# Patient Record
Sex: Female | Born: 1970 | Race: Black or African American | Hispanic: No | Marital: Married | Smoking: Former smoker
Health system: Southern US, Community
[De-identification: ages and names within clinical notes are randomized; demographics above are authoritative.]

## PROBLEM LIST (undated history)

## (undated) DIAGNOSIS — B373 Candidiasis of vulva and vagina: Secondary | ICD-10-CM

## (undated) DIAGNOSIS — IMO0002 Reserved for concepts with insufficient information to code with codable children: Secondary | ICD-10-CM

## (undated) DIAGNOSIS — Z8669 Personal history of other diseases of the nervous system and sense organs: Secondary | ICD-10-CM

## (undated) DIAGNOSIS — F32A Depression, unspecified: Secondary | ICD-10-CM

## (undated) DIAGNOSIS — K219 Gastro-esophageal reflux disease without esophagitis: Secondary | ICD-10-CM

## (undated) DIAGNOSIS — C801 Malignant (primary) neoplasm, unspecified: Secondary | ICD-10-CM

## (undated) DIAGNOSIS — F419 Anxiety disorder, unspecified: Secondary | ICD-10-CM

## (undated) DIAGNOSIS — K8050 Calculus of bile duct without cholangitis or cholecystitis without obstruction: Secondary | ICD-10-CM

## (undated) DIAGNOSIS — E0590 Thyrotoxicosis, unspecified without thyrotoxic crisis or storm: Secondary | ICD-10-CM

## (undated) DIAGNOSIS — I10 Essential (primary) hypertension: Secondary | ICD-10-CM

## (undated) DIAGNOSIS — G43909 Migraine, unspecified, not intractable, without status migrainosus: Secondary | ICD-10-CM

## (undated) HISTORY — DX: Anxiety disorder, unspecified: F41.9

## (undated) HISTORY — DX: Gastro-esophageal reflux disease without esophagitis: K21.9

## (undated) HISTORY — DX: Depression, unspecified: F32.A

## (undated) HISTORY — DX: Malignant (primary) neoplasm, unspecified: C80.1

## (undated) HISTORY — DX: Personal history of other diseases of the nervous system and sense organs: Z86.69

## (undated) HISTORY — PX: MULTIPLE TOOTH EXTRACTIONS: SHX2053

## (undated) HISTORY — PX: KNEE ARTHROSCOPY: SUR90

## (undated) HISTORY — PX: CERCLAGE REMOVAL: SUR1451

## (undated) HISTORY — DX: Reserved for concepts with insufficient information to code with codable children: IMO0002

## (undated) HISTORY — PX: WISDOM TOOTH EXTRACTION: SHX21

## (undated) HISTORY — DX: Migraine, unspecified, not intractable, without status migrainosus: G43.909

## (undated) HISTORY — PX: OTHER SURGICAL HISTORY: SHX169

## (undated) HISTORY — DX: Candidiasis of vulva and vagina: B37.3

## (undated) HISTORY — PX: TUBAL LIGATION: SHX77

---

## 1998-07-25 ENCOUNTER — Encounter: Admission: RE | Admit: 1998-07-25 | Discharge: 1998-10-23 | Payer: Self-pay | Admitting: Obstetrics

## 1998-08-26 ENCOUNTER — Inpatient Hospital Stay (HOSPITAL_COMMUNITY): Admission: AD | Admit: 1998-08-26 | Discharge: 1998-08-26 | Payer: Self-pay | Admitting: *Deleted

## 1998-08-29 ENCOUNTER — Inpatient Hospital Stay (HOSPITAL_COMMUNITY): Admission: AD | Admit: 1998-08-29 | Discharge: 1998-08-29 | Payer: Self-pay | Admitting: Obstetrics & Gynecology

## 1998-09-10 ENCOUNTER — Inpatient Hospital Stay (HOSPITAL_COMMUNITY): Admission: AD | Admit: 1998-09-10 | Discharge: 1998-09-11 | Payer: Self-pay | Admitting: Obstetrics

## 1998-09-18 ENCOUNTER — Encounter: Admission: RE | Admit: 1998-09-18 | Discharge: 1998-09-18 | Payer: Self-pay | Admitting: Obstetrics & Gynecology

## 1998-10-02 ENCOUNTER — Encounter: Admission: RE | Admit: 1998-10-02 | Discharge: 1998-10-02 | Payer: Self-pay | Admitting: Obstetrics

## 1998-10-17 ENCOUNTER — Encounter: Admission: RE | Admit: 1998-10-17 | Discharge: 1998-10-17 | Payer: Self-pay | Admitting: Obstetrics

## 1998-10-24 ENCOUNTER — Ambulatory Visit (HOSPITAL_COMMUNITY): Admission: RE | Admit: 1998-10-24 | Discharge: 1998-10-24 | Payer: Self-pay | Admitting: Obstetrics

## 1998-10-24 ENCOUNTER — Encounter: Admission: RE | Admit: 1998-10-24 | Discharge: 1998-10-24 | Payer: Self-pay | Admitting: Obstetrics

## 1998-11-04 ENCOUNTER — Observation Stay (HOSPITAL_COMMUNITY): Admission: AD | Admit: 1998-11-04 | Discharge: 1998-11-05 | Payer: Self-pay | Admitting: Obstetrics & Gynecology

## 1998-11-08 ENCOUNTER — Inpatient Hospital Stay (HOSPITAL_COMMUNITY): Admission: AD | Admit: 1998-11-08 | Discharge: 1998-11-08 | Payer: Self-pay | Admitting: *Deleted

## 1998-12-12 ENCOUNTER — Encounter: Admission: RE | Admit: 1998-12-12 | Discharge: 1998-12-12 | Payer: Self-pay | Admitting: Obstetrics

## 1998-12-16 ENCOUNTER — Inpatient Hospital Stay (HOSPITAL_COMMUNITY): Admission: AD | Admit: 1998-12-16 | Discharge: 1998-12-20 | Payer: Self-pay | Admitting: *Deleted

## 1998-12-26 ENCOUNTER — Encounter: Admission: RE | Admit: 1998-12-26 | Discharge: 1998-12-26 | Payer: Self-pay | Admitting: Obstetrics

## 1999-01-13 ENCOUNTER — Inpatient Hospital Stay (HOSPITAL_COMMUNITY): Admission: AD | Admit: 1999-01-13 | Discharge: 1999-01-13 | Payer: Self-pay | Admitting: Obstetrics & Gynecology

## 1999-01-16 ENCOUNTER — Encounter: Admission: RE | Admit: 1999-01-16 | Discharge: 1999-01-16 | Payer: Self-pay | Admitting: Obstetrics

## 1999-01-23 ENCOUNTER — Encounter: Admission: RE | Admit: 1999-01-23 | Discharge: 1999-01-23 | Payer: Self-pay | Admitting: Obstetrics

## 1999-01-26 ENCOUNTER — Inpatient Hospital Stay (HOSPITAL_COMMUNITY): Admission: AD | Admit: 1999-01-26 | Discharge: 1999-01-29 | Payer: Self-pay | Admitting: Obstetrics & Gynecology

## 1999-01-31 ENCOUNTER — Encounter (HOSPITAL_COMMUNITY): Admission: RE | Admit: 1999-01-31 | Discharge: 1999-05-01 | Payer: Self-pay | Admitting: *Deleted

## 2001-03-17 ENCOUNTER — Emergency Department (HOSPITAL_COMMUNITY): Admission: EM | Admit: 2001-03-17 | Discharge: 2001-03-17 | Payer: Self-pay | Admitting: Emergency Medicine

## 2001-03-24 ENCOUNTER — Emergency Department (HOSPITAL_COMMUNITY): Admission: EM | Admit: 2001-03-24 | Discharge: 2001-03-24 | Payer: Self-pay | Admitting: Emergency Medicine

## 2004-08-24 ENCOUNTER — Inpatient Hospital Stay (HOSPITAL_COMMUNITY): Admission: EM | Admit: 2004-08-24 | Discharge: 2004-08-25 | Payer: Self-pay | Admitting: Emergency Medicine

## 2004-08-27 ENCOUNTER — Other Ambulatory Visit (HOSPITAL_COMMUNITY): Admission: RE | Admit: 2004-08-27 | Discharge: 2004-11-25 | Payer: Self-pay | Admitting: Psychiatry

## 2004-08-27 ENCOUNTER — Ambulatory Visit: Payer: Self-pay | Admitting: Psychiatry

## 2004-09-16 ENCOUNTER — Ambulatory Visit (HOSPITAL_COMMUNITY): Payer: Self-pay | Admitting: Professional Counselor

## 2004-09-23 ENCOUNTER — Ambulatory Visit (HOSPITAL_COMMUNITY): Payer: Self-pay | Admitting: Professional Counselor

## 2004-10-06 ENCOUNTER — Ambulatory Visit (HOSPITAL_COMMUNITY): Payer: Self-pay | Admitting: Professional Counselor

## 2004-10-10 ENCOUNTER — Ambulatory Visit (HOSPITAL_COMMUNITY): Payer: Self-pay | Admitting: Psychiatry

## 2004-10-15 ENCOUNTER — Ambulatory Visit (HOSPITAL_COMMUNITY): Payer: Self-pay | Admitting: Professional Counselor

## 2004-10-23 ENCOUNTER — Ambulatory Visit (HOSPITAL_COMMUNITY): Payer: Self-pay | Admitting: Professional Counselor

## 2004-11-05 ENCOUNTER — Ambulatory Visit (HOSPITAL_COMMUNITY): Payer: Self-pay | Admitting: Psychiatry

## 2005-02-20 ENCOUNTER — Emergency Department (HOSPITAL_COMMUNITY): Admission: EM | Admit: 2005-02-20 | Discharge: 2005-02-20 | Payer: Self-pay | Admitting: Emergency Medicine

## 2005-06-16 ENCOUNTER — Emergency Department (HOSPITAL_COMMUNITY): Admission: EM | Admit: 2005-06-16 | Discharge: 2005-06-16 | Payer: Self-pay | Admitting: Emergency Medicine

## 2005-06-16 ENCOUNTER — Ambulatory Visit (HOSPITAL_COMMUNITY): Admission: RE | Admit: 2005-06-16 | Discharge: 2005-06-16 | Payer: Self-pay | Admitting: Emergency Medicine

## 2006-01-29 ENCOUNTER — Emergency Department (HOSPITAL_COMMUNITY): Admission: EM | Admit: 2006-01-29 | Discharge: 2006-01-29 | Payer: Self-pay | Admitting: Emergency Medicine

## 2006-09-10 ENCOUNTER — Inpatient Hospital Stay (HOSPITAL_COMMUNITY): Admission: AD | Admit: 2006-09-10 | Discharge: 2006-09-10 | Payer: Self-pay | Admitting: Obstetrics & Gynecology

## 2007-04-22 ENCOUNTER — Ambulatory Visit (HOSPITAL_COMMUNITY): Admission: RE | Admit: 2007-04-22 | Discharge: 2007-04-22 | Payer: Self-pay | Admitting: Obstetrics & Gynecology

## 2007-05-31 ENCOUNTER — Inpatient Hospital Stay (HOSPITAL_COMMUNITY): Admission: AD | Admit: 2007-05-31 | Discharge: 2007-05-31 | Payer: Self-pay | Admitting: Obstetrics & Gynecology

## 2007-06-09 ENCOUNTER — Ambulatory Visit (HOSPITAL_COMMUNITY): Admission: RE | Admit: 2007-06-09 | Discharge: 2007-06-09 | Payer: Self-pay | Admitting: Obstetrics & Gynecology

## 2007-09-02 ENCOUNTER — Encounter: Admission: RE | Admit: 2007-09-02 | Discharge: 2007-10-07 | Payer: Self-pay | Admitting: Obstetrics & Gynecology

## 2007-10-14 ENCOUNTER — Inpatient Hospital Stay (HOSPITAL_COMMUNITY): Admission: AD | Admit: 2007-10-14 | Discharge: 2007-10-14 | Payer: Self-pay | Admitting: Obstetrics & Gynecology

## 2007-11-04 ENCOUNTER — Inpatient Hospital Stay (HOSPITAL_COMMUNITY): Admission: AD | Admit: 2007-11-04 | Discharge: 2007-11-04 | Payer: Self-pay | Admitting: Obstetrics

## 2007-11-19 ENCOUNTER — Inpatient Hospital Stay (HOSPITAL_COMMUNITY): Admission: AD | Admit: 2007-11-19 | Discharge: 2007-11-21 | Payer: Self-pay | Admitting: Obstetrics

## 2007-11-20 ENCOUNTER — Encounter: Payer: Self-pay | Admitting: Obstetrics

## 2007-11-28 ENCOUNTER — Inpatient Hospital Stay (HOSPITAL_COMMUNITY): Admission: AD | Admit: 2007-11-28 | Discharge: 2007-12-01 | Payer: Self-pay | Admitting: Obstetrics

## 2007-11-30 ENCOUNTER — Ambulatory Visit: Payer: Self-pay | Admitting: Cardiology

## 2008-05-02 ENCOUNTER — Emergency Department (HOSPITAL_COMMUNITY): Admission: EM | Admit: 2008-05-02 | Discharge: 2008-05-02 | Payer: Self-pay | Admitting: Emergency Medicine

## 2009-01-21 ENCOUNTER — Encounter: Admission: RE | Admit: 2009-01-21 | Discharge: 2009-01-21 | Payer: Self-pay | Admitting: Family Medicine

## 2010-06-11 ENCOUNTER — Emergency Department (HOSPITAL_COMMUNITY): Admission: EM | Admit: 2010-06-11 | Discharge: 2010-06-11 | Payer: Self-pay | Admitting: Emergency Medicine

## 2010-06-11 ENCOUNTER — Emergency Department (HOSPITAL_COMMUNITY): Admission: EM | Admit: 2010-06-11 | Discharge: 2010-06-11 | Payer: Self-pay | Admitting: Family Medicine

## 2010-06-12 ENCOUNTER — Emergency Department (HOSPITAL_COMMUNITY): Admission: EM | Admit: 2010-06-12 | Discharge: 2010-06-12 | Payer: Self-pay | Admitting: Emergency Medicine

## 2011-02-22 LAB — DIFFERENTIAL
Basophils Absolute: 0 10*3/uL (ref 0.0–0.1)
Basophils Absolute: 0 10*3/uL (ref 0.0–0.1)
Basophils Relative: 0 % (ref 0–1)
Eosinophils Absolute: 0.1 10*3/uL (ref 0.0–0.7)
Eosinophils Relative: 1 % (ref 0–5)
Lymphocytes Relative: 49 % — ABNORMAL HIGH (ref 12–46)
Lymphocytes Relative: 42 % (ref 12–46)
Lymphs Abs: 2.7 10*3/uL (ref 0.7–4.0)
Lymphs Abs: 2.7 10*3/uL (ref 0.7–4.0)
Neutrophils Relative %: 43 % (ref 43–77)

## 2011-02-22 LAB — GC/CHLAMYDIA PROBE AMP, GENITAL
Chlamydia, DNA Probe: NEGATIVE
GC Probe Amp, Genital: NEGATIVE

## 2011-02-22 LAB — POCT PREGNANCY, URINE
Preg Test, Ur: NEGATIVE
Preg Test, Ur: NEGATIVE

## 2011-02-22 LAB — CBC
HCT: 43.1 % (ref 36.0–46.0)
Hemoglobin: 14.9 g/dL (ref 12.0–15.0)
MCH: 31.7 pg (ref 26.0–34.0)
MCHC: 34.6 g/dL (ref 30.0–36.0)
MCHC: 34.8 g/dL (ref 30.0–36.0)
Platelets: 167 10*3/uL (ref 150–400)
RBC: 4.7 MIL/uL (ref 3.87–5.11)
RBC: 4.76 MIL/uL (ref 3.87–5.11)
RDW: 12.6 % (ref 11.5–15.5)
RDW: 12.8 % (ref 11.5–15.5)
WBC: 5.4 10*3/uL (ref 4.0–10.5)

## 2011-02-22 LAB — COMPREHENSIVE METABOLIC PANEL
ALT: 17 U/L (ref 0–35)
BUN: 14 mg/dL (ref 6–23)
CO2: 25 mEq/L (ref 19–32)
GFR calc non Af Amer: >60 mL/min (ref >60)
Glucose, Bld: 105 mg/dL — ABNORMAL HIGH (ref 70–99)
Potassium: 4 mEq/L (ref 3.5–5.1)
Total Protein: 7.3 g/dL (ref 6.0–8.3)

## 2011-02-22 LAB — URINALYSIS, ROUTINE W REFLEX MICROSCOPIC
Nitrite: NEGATIVE
Protein, ur: NEGATIVE mg/dL
Specific Gravity, Urine: 1.04 — ABNORMAL HIGH (ref 1.005–1.030)

## 2011-02-22 LAB — POCT URINALYSIS DIP (DEVICE)
Glucose, UA: NEGATIVE mg/dL
Hgb urine dipstick: NEGATIVE
Nitrite: POSITIVE — AB
pH: 7 (ref 5.0–8.0)

## 2011-02-22 LAB — POCT I-STAT, CHEM 8
Chloride: 104 mEq/L (ref 96–112)
Creatinine, Ser: 1.2 mg/dL (ref 0.4–1.2)
HCT: 46 % (ref 36.0–46.0)
TCO2: 26 mmol/L (ref 0–100)

## 2011-02-22 LAB — WET PREP, GENITAL
Trich, Wet Prep: NONE SEEN
Yeast Wet Prep HPF POC: NONE SEEN

## 2011-04-21 NOTE — Op Note (Signed)
NAMEIONE, SANDUSKY             ACCOUNT NO.:  [id number]   MEDICAL RECORD NO.:  [id number]          PATIENT TYPE:  AMB   LOCATION:  SDC                           [facility]:  [facility]   PHYSICIAN:  Roseanna Rainbow, M.D.DATE OF BIRTH:  1971/12/06   DATE OF PROCEDURE:  06/09/2007  DATE OF DISCHARGE:                               OPERATIVE REPORT   PREOPERATIVE DIAGNOSIS:  Intrauterine pregnancy at 14+ weeks, cervical  insufficiency.   POSTOPERATIVE DIAGNOSIS:  Intrauterine pregnancy at 14+ weeks, cervical  insufficiency.   PROCEDURE:  McDonald cerclage.   SURGEON:  Dr. Tamela Oddi   ANESTHESIA:  Spinal.   ESTIMATED BLOOD LOSS:  100 mL.   COMPLICATIONS:  None.   PROCEDURE:  The patient was taken to the operating room with an IV  running.  She was given a spinal anesthetic and placed in the dorsal  lithotomy position and prepped and draped in the usual sterile fashion.  A weighted speculum and Sims retractors were then placed into the  vagina.  The demarcation between the cervix and bladder point was  obscured somewhat by the previous scarring from her previous cerclage  procedures.  The vaginal mucosa at the junction between the cervix and  bladder was infiltrated with sterile saline.  The vaginal mucosa was  then incised with a scalpel.  The vaginal mucosa was then incised  anteriorly.  The demarcation between the cervix and the bladder was  better defined using blunt dissection.  At this point a double #1 silk  suture was then placed in the cervix stroma in a pursestring fashion  taking care to avoid the endocervical canal.  The suture was then tied  at 12 o'clock.  The defect in the vaginal mucosa was reapproximated  using interrupted figure-of-eight sutures of 2-0 Vicryl.  Adequate  hemostasis was noted.  The vagina was then irrigated.  All the  instruments were then removed from the vagina.  At the close of the  procedure the instrument pack counts were said to be  correct x2.  The  patient was taken to the PACU awake and in stable condition.      Roseanna Rainbow, M.D.  Electronically Signed     LAJ/MEDQ  D:  06/09/2007  T:  06/09/2007  Job:  [id number]

## 2011-04-21 NOTE — Op Note (Signed)
[name], Kathy Powell             ACCOUNT NO.:  [id number]   MEDICAL RECORD NO.:  [id number]          PATIENT TYPE:  INP   LOCATION:  9130                          [facility]ACILITY:  [facility]   PHYSICIAN:  Charles A. Clearance Coots, M.D.DATE OF BIRTH:  April 03, 1971   DATE OF PROCEDURE:  11/20/2007  DATE OF DISCHARGE:                               OPERATIVE REPORT   PREOPERATIVE DIAGNOSIS:  Desires sterilization.   POSTOPERATIVE DIAGNOSIS:  Desires sterilization.   PROCEDURE:  Bilateral partial salpingectomy.   SURGEON:  Brock Bad, MD   ANESTHESIA:  Epidural.   ESTIMATED BLOOD LOSS:  Negligible.   COMPLICATIONS:  None.   SPECIMENS:  Approximately 2 cm section of right and left fallopian tube.   DISPOSITION OF SPECIMEN:  Pathology.   PROCEDURE IN DETAIL:  The patient was brought to the operating room  after satisfactory redosing of the epidural.  The abdomen was prepped  and draped in the usual sterile fashion.  A small inferior umbilical  incision was made with a scalpel, that was deepened down to the fascia  with curved Mayo scissors bluntly.  The fascia was grasped in the  midline with Kocher forceps and was cut transversely with curved Mayo  scissors.  The peritoneum was grasped with hemostats and was bluntly  entered with the right angle retractors.  The left fallopian tube was  identified and was grasped with a Babcock clamp.  The tube was  identified from the cornual end to the fimbrial end and grasped with  Babcock clamps and the tube was then regrasped in the isthmic area of  the tube with a Babcock clamp and a knuckle of tube beneath the Babcock  clamp was suture ligated through the mesosalpinx with zero plain catgut.  The section of the tube above the knot was excised with Metzenbaum  scissors and submitted to pathology for evaluation.  There was no active  bleeding at the conclusion of the procedure.  The tube was then placed  back in its normal anatomic position.  The same  procedure was performed  on the opposite side without complications.  The abdomen was then closed  as follows.  Peritoneum and fascia was closed as one with a continuous  suture of 2-0  Vicryl.  Skin was closed with a continuous subcuticular suture of 3-0  Monocryl.  Sterile bandage was applied to the incision closure.  Surgical technician indicated that all needle, sponge and instrument  counts were correct x2.  The patient tolerated the procedure well, was  transported to the recovery room in satisfactory condition.      Charles A. Clearance Coots, M.D.  Electronically Signed     CAH/MEDQ  D:  11/20/2007  T:  11/21/2007  Job:  [id number]

## 2011-04-24 NOTE — H&P (Signed)
NAME:  Kathy Powell, Kathy Powell                       ACCOUNT NO.:  [id number]   MEDICAL RECORD NO.:  [id number]                   PATIENT TYPE:  INP   LOCATION:  5021                                 FACILITY:  [facility]   PHYSICIAN:  Renato Battles, M.D.                  DATE OF BIRTH:  July 12, 1971   DATE OF ADMISSION:  08/23/2004  DATE OF DISCHARGE:                                HISTORY & PHYSICAL   REASON FOR ADMISSION:  Overdose and suicide attempt.   HISTORY OF PRESENT ILLNESS:  The patient is a 40 year old African-American  female who overdosed on prescription Xanax and was brought to the emergency  room with over sedation.  The patient reported to EMS that she had intention  to hurt herself because she has been under a lot of stress.  At the time of  interview, the patient was extremely sleepy.  She was arousable but she  could not give any meaningful conversation, so she was limited.   PAST MEDICAL HISTORY:  Positive for anxiety.   PAST SURGICAL HISTORY:  None.   FAMILY HISTORY:  Unknown.   SOCIAL HISTORY:  Not obtainable.   ALLERGIES:  No known drug allergies.   HOME MEDICATIONS:  1.  Xanax.  2.  Naproxen.  3.  Prilosec.   PHYSICAL EXAMINATION:  GENERAL:  The patient is very sleepy, arousable but  cannot not hold conversation.  VITAL SIGNS:  Temperature 98.3, heart rate 82, respiratory rate 20, blood  pressure 120/72.  HEENT:  Head is atraumatic and normocephalic.  Pupils equal, round, and  reactive to light and accommodation.  Extraocular movements intact.  NECK:  No adenopathy, no thyromegaly, no JVD.  CHEST:  Clear to auscultation bilaterally.  No wheezing, rales, or rhonchi.  HEART:  Regular rate and rhythm.  No murmurs.  ABDOMEN:  Soft, moderate tenderness in the right lower quadrant and left  lower quadrant.  No guarding, no rebound tenderness.  EXTREMITIES:  No cyanosis, edema, or clubbing.   STUDIES:  ISTAT showed normal electrolytes.  Normal hemoglobin and  hematocrit.  Pregnancy test was negative.  Alcohol level was low.  Aspirin  and acetaminophen levels were also.  Urine drug screen was positive only for  benzodiazepines.   IMPRESSION:  1.  Suicide attempt.  2.  Benzodiazepine overdose.  3.  Anxiety.   PLAN:  1.  Admit with sitter.  2.  Monitor for next 24 hours until patient wakes up.  Would not use      __________.  3.  Consult psychiatry for clearing patient or transferring to psychiatry      service.   PRIMARY CARE PHYSICIAN:  Renaye Rakers, M.D.       SA/MEDQ  D:  08/24/2004  T:  08/24/2004  Job:  [id number]   cc:   Renaye Rakers, M.D.  [phone]  [place]  [place] [address]  Fax: [phone]

## 2011-04-24 NOTE — Discharge Summary (Signed)
Kathy Powell, CALKIN             ACCOUNT NO.:  [id number]   MEDICAL RECORD NO.:  [id number]          PATIENT TYPE:  INP   LOCATION:  9319                          [facility]   PHYSICIAN:  Roseanna Rainbow, M.D.DATE OF BIRTH:  1971/09/20   DATE OF ADMISSION:  11/28/2007  DATE OF DISCHARGE:  12/01/2007                               DISCHARGE SUMMARY   CHIEF COMPLAINT:  The patient is a 40 year old gravida 5, para 5, status  post spontaneous vaginal delivery on December 13th, who presented to the  office with elevated blood pressures.   HISTORY OF PRESENT ILLNESS:  The patient also complained of headaches  and pleuritic chest pain.   PAST SURGICAL HISTORY:  Cervical conization.   PAST MEDICAL HISTORY:  She denies.   MEDICATIONS:  Prenatal vitamins.   ALLERGIES:  ASPIRIN.   SOCIAL HISTORY:  She is engaged.  She reports current tobacco use.  She  denies any ethanol or drug use.   PHYSICAL EXAM:  VITAL SIGNS:  Afebrile, blood pressure 170/90s to 100s.  LUNGS:  Clear to auscultation.  HEART:  Regular rate and rhythm.  PELVIC:  Exam omitted.  EXTREMITIES:  Deep tendon reflexes, no clonus, 2+ edema.   LABS:  Uric acid 7.6, SGOT and SGPT 28 and 42 respectively.  Platelets  260,000.   Chest x-ray:  Pleural effusions and interstitial edema.   ASSESSMENT AND PLAN:  Postpartum severe preeclampsia.  Plan admission,  magnesium sulfate, seizure prophylaxis, p.o. labetalol.   HOSPITAL COURSE:  The patient was admitted.  She was started on  parenteral magnesium sulfate.  Her headaches improved.  Her blood  pressures were controlled on the labetalol.  The magnesium sulfate was  discontinued on December 24.  Her O2 sats had remained greater than 95%  on room air.  The chest x-ray was repeated, and there was no active  disease.  A 2-D echo on December 24 was read as within normal limits  with slight left ventricular hypertrophy.  An EKG demonstrated normal  sinus rhythm at 80  beats per minute.   LABS:  On December 25, borderline hypokalemia with a potassium of 3.2.  A thyroid panel was normal.  Cardiac enzymes were normal as well.  At  this point, the decision was made to discharge the patient home.   DISCHARGE DIAGNOSIS:  Postpartum severe preeclampsia with pulmonary  edema, condition stable.   DIET:  Regular.   ACTIVITY:  Modified bed rest.   MEDICATIONS:  Included labetalol, hydrochlorothiazide, and K-Dur.   DISPOSITION:  The patient was to follow up in the office in several  days.      Roseanna Rainbow, M.D.  Electronically Signed     LAJ/MEDQ  D:  12/23/2007  T:  12/24/2007  Job:  [id number]

## 2011-09-02 LAB — POCT URINALYSIS DIP (DEVICE)
Glucose, UA: NEGATIVE
Ketones, ur: NEGATIVE
Nitrite: NEGATIVE
Operator id: 247071
pH: 5.5

## 2011-09-02 LAB — DIFFERENTIAL
Basophils Absolute: 0
Basophils Relative: 0
Eosinophils Absolute: 0
Monocytes Relative: 5
Neutro Abs: 4.6
Neutrophils Relative %: 79 — ABNORMAL HIGH

## 2011-09-02 LAB — HEPATIC FUNCTION PANEL
AST: 14
Albumin: 4.2
Bilirubin, Direct: 0.1
Total Bilirubin: 0.7

## 2011-09-02 LAB — POCT PREGNANCY, URINE: Preg Test, Ur: NEGATIVE

## 2011-09-02 LAB — CBC
MCHC: 34.7
MCV: 86.5
RBC: 4.99
RDW: 12.8

## 2011-09-02 LAB — LIPASE, BLOOD: Lipase: 15

## 2011-09-11 LAB — BASIC METABOLIC PANEL
BUN: 5 — ABNORMAL LOW
CO2: 29
Calcium: 7.6 — ABNORMAL LOW
Chloride: 103
Chloride: 104
Creatinine, Ser: 0.92
GFR calc Af Amer: >60
Potassium: 3 — ABNORMAL LOW
Sodium: 141

## 2011-09-11 LAB — COMPREHENSIVE METABOLIC PANEL
AST: 28
Albumin: 3 — ABNORMAL LOW
Calcium: 8.6
Creatinine, Ser: 0.86
GFR calc Af Amer: >60
Sodium: 138
Total Protein: 5.8 — ABNORMAL LOW

## 2011-09-11 LAB — TSH: TSH: 0.176 — ABNORMAL LOW

## 2011-09-11 LAB — URINALYSIS, ROUTINE W REFLEX MICROSCOPIC
Ketones, ur: NEGATIVE
Leukocytes, UA: NEGATIVE
Nitrite: NEGATIVE
Protein, ur: NEGATIVE
pH: 6.5

## 2011-09-11 LAB — CARDIAC PANEL(CRET KIN+CKTOT+MB+TROPI)
CK, MB: 1
Relative Index: 0.6
Total CK: 169

## 2011-09-11 LAB — URINE MICROSCOPIC-ADD ON

## 2011-09-11 LAB — CBC
MCHC: 34.8
MCV: 89.5
Platelets: 260

## 2011-09-14 LAB — CBC
Hemoglobin: 10.6 — ABNORMAL LOW
MCHC: 35.3
MCV: 88.5
Platelets: 177
Platelets: 195
RDW: 14.7
WBC: 7.7

## 2011-09-14 LAB — RPR: RPR Ser Ql: NONREACTIVE

## 2011-09-15 LAB — URINALYSIS, ROUTINE W REFLEX MICROSCOPIC
Nitrite: NEGATIVE
Specific Gravity, Urine: 1.01
Urobilinogen, UA: 0.2

## 2011-09-22 LAB — CBC
RBC: 4.47
WBC: 8.1

## 2012-09-21 ENCOUNTER — Encounter: Payer: Self-pay | Admitting: Obstetrics and Gynecology

## 2012-09-21 ENCOUNTER — Ambulatory Visit (INDEPENDENT_AMBULATORY_CARE_PROVIDER_SITE_OTHER): Payer: 59 | Admitting: Obstetrics and Gynecology

## 2012-09-21 ENCOUNTER — Telehealth: Payer: Self-pay

## 2012-09-21 VITALS — BP 118/76 | Ht 65.6 in | Wt 242.0 lb

## 2012-09-21 DIAGNOSIS — Z01419 Encounter for gynecological examination (general) (routine) without abnormal findings: Secondary | ICD-10-CM

## 2012-09-21 DIAGNOSIS — Z124 Encounter for screening for malignant neoplasm of cervix: Secondary | ICD-10-CM

## 2012-09-21 NOTE — Progress Notes (Addendum)
The patient reports:normal menses, no abnormal bleeding, pelvic pain or discharge  Contraception:bilateral tubal ligation  Last mammogram: patient has never had a mammogram  Last pap: was normal August  2012  GC/Chlamydia cultures offered: declined HIV/RPR/HbsAg offered:  declined HSV 1 and 2 glycoprotein offered: declined  Menstrual cycle regular and monthly: Yes Menstrual flow normal: Yes  Urinary symptoms: none Normal bowel movements: Yes Reports abuse at home: No:  Pt stated no issues today .  Subjective:    Kathy Powell is a 41 y.o. female, [id number], who presents for an annual exam as a new patient today. Had PP BTL and may be interested in tubal reversal.    History   Social History  . Marital Status: Unknown    Spouse Name: N/A    Number of Children: N/A  . Years of Education: N/A   Social History Main Topics  . Smoking status: Light Tobacco Smoker  . Smokeless tobacco: Never Used  . Alcohol Use: No  . Drug Use: No  . Sexually Active: Yes    Birth Control/ Protection: Surgical     BTL   Other Topics Concern  . None   Social History Narrative  . None    Menstrual cycle:   LMP: Patient's last menstrual period was 08/09/2012.           Cycle: normal  The following portions of the patient's history were reviewed and updated as appropriate: allergies, current medications, past family history, past medical history, past social history, past surgical history and problem list.  Review of Systems Pertinent items are noted in HPI. Breast:Negative for breast lump,nipple discharge or nipple retraction Gastrointestinal: Negative for abdominal pain, change in bowel habits or rectal bleeding Urinary:negative   Objective:    BP 118/76  Ht 5' 5.6" (1.666 m)  Wt 242 lb (109.77 kg)  BMI 39.54 kg/m2  LMP 08/09/2012    Weight:  Wt Readings from Last 1 Encounters:  09/21/12 242 lb (109.77 kg)          BMI: Body mass index is 39.54 kg/(m^2).  General Appearance:  Alert, appropriate appearance for age. No acute distress HEENT: Grossly normal Neck / Thyroid: Supple, no masses, nodes or enlargement Lungs: clear to auscultation bilaterally Back: No CVA tenderness Breast Exam: No masses or nodes.No dimpling, nipple retraction or discharge. Cardiovascular: Regular rate and rhythm. S1, S2, no murmur Gastrointestinal: Soft, non-tender, no masses or organomegaly Pelvic Exam: Vulva and vagina appear normal. Bimanual exam reveals normal uterus and adnexa. Rectovaginal: not indicated Lymphatic Exam: Non-palpable nodes in neck, clavicular, axillary, or inguinal regions Skin: no rash or abnormalities Neurologic: Normal gait and speech, no tremor  Psychiatric: Alert and oriented, appropriate affect.   Assessment:    Normal gyn exam  S/p PP BTL maybe interested in tubal reversal   Plan:    pap smear return annually or prn STD screening: declined Contraception:bilateral tubal ligation  Will obtain op report for BTL to determine if pt is a candidate for tubal reversal. Will call pt after review. If desires to proceed, will nee [facility] before   Silverio Lay MD      Op report received: [place] with 1.5 cm  and 1.3 cm of tubal segment removed. Pt may be a candidate for reversal but needs AMH before proceeding further. Will call pt.  Silverio Lay MD

## 2012-09-21 NOTE — Telephone Encounter (Signed)
LM to RC.  Pt may be candidate for anastimosis.  If she wishes to proceed, she will need AMH.  Dx lap will be done first, if found to be favorable will go ahead with surg. Per SR.  ld

## 2012-09-27 ENCOUNTER — Telehealth: Payer: Self-pay

## 2012-09-27 DIAGNOSIS — N979 Female infertility, unspecified: Secondary | ICD-10-CM

## 2012-09-27 NOTE — Telephone Encounter (Signed)
Pt notified of SR note.  Pt needs AMH, then surg was explained that dx lap will be done first, if found to be favorable, she will proceeds with anastimosis.  Pt agreeable. ld

## 2012-09-30 ENCOUNTER — Other Ambulatory Visit: Payer: 59

## 2012-09-30 DIAGNOSIS — N979 Female infertility, unspecified: Secondary | ICD-10-CM

## 2012-11-23 ENCOUNTER — Telehealth: Payer: Self-pay

## 2012-11-23 NOTE — Telephone Encounter (Signed)
Advised pt of lab results. Pt voiced understanding. Kathy Powell, CMA

## 2012-11-23 NOTE — Progress Notes (Signed)
Quick Note:  Please inform that AMH is normal: good egg reserve ______

## 2012-11-23 NOTE — Telephone Encounter (Signed)
Advised pt of normal lab work. Pt is wanting to know what the next step from here is states that she was planning on tubal reversal.   Darien Ramus, CMA

## 2012-11-23 NOTE — Telephone Encounter (Signed)
Message copied by Darien Ramus on Wed Nov 23, 2012  6:15 PM ------      Message from: Silverio Lay      Created: Wed Nov 23, 2012  2:34 PM       Please inform that [facility]H is normal: good egg reserve

## 2012-11-25 NOTE — Telephone Encounter (Signed)
Needs to talk to Adrianne for scheduling, pre-payment and pre-op visit with EP / SR

## 2012-11-28 ENCOUNTER — Telehealth: Payer: Self-pay | Admitting: Obstetrics and Gynecology

## 2013-01-20 ENCOUNTER — Emergency Department (HOSPITAL_COMMUNITY)
Admission: EM | Admit: 2013-01-20 | Discharge: 2013-01-20 | Disposition: A | Discharge status: Home / Self Care | Payer: 59 | Source: Home / Self Care | Attending: Emergency Medicine | Admitting: Emergency Medicine

## 2013-01-20 ENCOUNTER — Encounter (HOSPITAL_COMMUNITY): Payer: Self-pay | Admitting: *Deleted

## 2013-01-20 DIAGNOSIS — K2970 Gastritis, unspecified, without bleeding: Secondary | ICD-10-CM

## 2013-01-20 DIAGNOSIS — K2990 Gastroduodenitis, unspecified, without bleeding: Secondary | ICD-10-CM

## 2013-01-20 DIAGNOSIS — R51 Headache: Secondary | ICD-10-CM

## 2013-01-20 MED ORDER — ONDANSETRON 4 MG PO TBDP
4.0000 mg | ORAL_TABLET | Freq: Once | ORAL | Status: AC
  Administered 2013-01-20: 4 mg via ORAL

## 2013-01-20 MED ORDER — ONDANSETRON 4 MG PO TBDP
ORAL_TABLET | ORAL | Status: AC
  Filled 2013-01-20: qty 1

## 2013-01-20 MED ORDER — ONDANSETRON HCL 4 MG PO TABS: 4.0000 mg | ORAL_TABLET | Freq: Three times a day (TID) | ORAL | Status: DC | PRN

## 2013-01-20 NOTE — ED Provider Notes (Signed)
Medical screening examination/treatment/procedure(s) were performed by non-physician practitioner and as supervising physician I was immediately available for consultation/collaboration.  Leslee Home, M.D.  Reuben Likes, MD 01/20/13 2024

## 2013-01-20 NOTE — ED Notes (Signed)
Report    Given       To  Rosalita Chessman

## 2013-01-20 NOTE — ED Notes (Signed)
Pt      Has  Multiple  Complaints  To include       Chills  Headache      Nausea   Vomiting  abd  Pain           And neck pain         She  Reports  The  Symptoms  Began  Yesterday  She      denys  Any          Diarrhea           She  Ambulated   To  Exam  Room  With a  Steady       Gait

## 2013-01-20 NOTE — ED Provider Notes (Signed)
History     CSN: [id number]  Arrival date & time 01/20/13  1511   First MD Initiated Contact with Patient 01/20/13 1753      Chief Complaint  Patient presents with  . Headache    (Consider location/radiation/quality/duration/timing/severity/associated sxs/prior treatment) HPI Comments: Pt with chronic, constant headaches for which she is receiving tx from Dr. Renae Gloss (takes depakote, flexeril and meloxicam daily).  Last night began vomiting, and since then has been unable to tolerate liquids.  Headache is now worse than usual, described as throbbing, different than usual.  Also feels lightheaded. ABd pain began after vomiting episodes. Also c/o entire back and neck pain/soreness that began after vomiting episodes began.   Patient is a 42 y.o. female presenting with vomiting. The history is provided by the patient.  Emesis Severity:  Moderate Timing:  Intermittent Number of daily episodes:  5 Quality:  Stomach contents Progression:  Unchanged Chronicity:  New Recent urination:  Decreased Relieved by:  None tried Worsened by:  Liquids Ineffective treatments:  None tried Associated symptoms: abdominal pain, chills and headaches   Associated symptoms: no diarrhea, no fever and no sore throat     Past Medical History  Diagnosis Date  . Abnormal Pap smear     10 years ago per pt   . Migraine   . Yeast infection of the vagina     Past Surgical History  Procedure Laterality Date  . Tubal ligation    . Cerclage removal      insertion and removal     Family History  Problem Relation Age of Onset  . Cancer Paternal Grandmother     lung  . Cancer Maternal Grandmother     lung  . Stroke Maternal Grandmother   . Hypertension Mother   . Asthma Son   . Thyroid disease Maternal Aunt   . Hypertension Maternal Aunt     History  Substance Use Topics  . Smoking status: Light Tobacco Smoker  . Smokeless tobacco: Never Used  . Alcohol Use: No    OB History   Grav Para  Term Preterm Abortions TAB SAB Ect Mult Living   6 4   2  2   4       Review of Systems  Constitutional: Positive for chills. Negative for fever.  HENT: Negative for congestion and sore throat.   Eyes: Positive for photophobia.  Gastrointestinal: Positive for nausea, vomiting and abdominal pain. Negative for diarrhea and constipation.  Neurological: Positive for light-headedness and headaches.    Allergies  Review of patient's allergies indicates no known allergies.  Home Medications   Current Outpatient Rx  Name  Route  Sig  Dispense  Refill  . Cyclobenzaprine HCl (FLEXERIL PO)   Oral   Take by mouth.         . Divalproex Sodium (DEPAKOTE PO)   Oral   Take by mouth.         . MELOXICAM PO   Oral   Take by mouth.         . Multiple Vitamin (MULTIVITAMIN) capsule   Oral   Take 1 capsule by mouth daily.         . ondansetron (ZOFRAN) 4 MG tablet   Oral   Take 1 tablet (4 mg total) by mouth every 8 (eight) hours as needed for nausea.   12 tablet   0     BP 135/82  Pulse 108  Temp(Src) 99.6 F (37.6 C) (Oral)  Resp 18  SpO2 97%  LMP 01/09/2013  Physical Exam  Constitutional: She appears well-developed and well-nourished.  Non-toxic appearance. She does not have a sickly appearance.  Uncomfortable appearing  HENT:  Right Ear: Tympanic membrane, external ear and ear canal normal.  Left Ear: Tympanic membrane, external ear and ear canal normal.  Nose: Nose normal.  Mouth/Throat: Oropharynx is clear and moist.  Cardiovascular: Regular rhythm.  Tachycardia present.   Pulmonary/Chest: Effort normal and breath sounds normal.  Abdominal: Soft. Normal appearance. Bowel sounds are increased. There is tenderness in the epigastric area. There is no rigidity, no guarding and no CVA tenderness.  Musculoskeletal:       Cervical back: She exhibits tenderness. She exhibits no bony tenderness.       Thoracic back: She exhibits tenderness. She exhibits no bony  tenderness.       Lumbar back: She exhibits tenderness. She exhibits no bony tenderness.  Back and neck muscles sore/tender to palp  Skin: Skin is warm. No rash noted.    ED Course  Procedures (including critical care time)  Labs Reviewed - No data to display No results found.   1. Gastritis   2. Headache       MDM  Pt received zofran and was able to keep down a small can of ginger ale. Reports feeling better.  Still slightly tachycardic. Offered pt IV fluid rehydration, pt declines, wants to go home and rehydrate there. Discussed reasons for seeking help in ER. Rx zofran for home.         Cathlyn Parsons, NP 01/20/13 [phone]

## 2013-02-15 ENCOUNTER — Other Ambulatory Visit: Payer: Self-pay | Admitting: Internal Medicine

## 2013-02-18 ENCOUNTER — Ambulatory Visit
Admission: RE | Admit: 2013-02-18 | Discharge: 2013-02-18 | Disposition: A | Discharge status: Home / Self Care | Payer: 59 | Source: Ambulatory Visit | Attending: Internal Medicine | Admitting: Internal Medicine

## 2013-02-18 DIAGNOSIS — G43019 Migraine without aura, intractable, without status migrainosus: Secondary | ICD-10-CM

## 2013-11-22 ENCOUNTER — Other Ambulatory Visit: Payer: Self-pay

## 2013-11-22 DIAGNOSIS — Z1231 Encounter for screening mammogram for malignant neoplasm of breast: Secondary | ICD-10-CM

## 2013-12-06 ENCOUNTER — Ambulatory Visit: Payer: 59

## 2014-09-11 ENCOUNTER — Ambulatory Visit (INDEPENDENT_AMBULATORY_CARE_PROVIDER_SITE_OTHER): Payer: 59 | Admitting: Podiatry

## 2014-09-11 ENCOUNTER — Ambulatory Visit (INDEPENDENT_AMBULATORY_CARE_PROVIDER_SITE_OTHER): Payer: 59

## 2014-09-11 ENCOUNTER — Encounter: Payer: Self-pay | Admitting: Podiatry

## 2014-09-11 VITALS — BP 139/95 | HR 81 | Resp 16 | Ht 66.0 in | Wt 210.0 lb

## 2014-09-11 DIAGNOSIS — M2010 Hallux valgus (acquired), unspecified foot: Secondary | ICD-10-CM

## 2014-09-11 DIAGNOSIS — M722 Plantar fascial fibromatosis: Secondary | ICD-10-CM

## 2014-09-11 MED ORDER — METHYLPREDNISOLONE (PAK) 4 MG PO TABS: ORAL_TABLET | ORAL | Status: DC

## 2014-09-11 NOTE — Progress Notes (Signed)
   Subjective:    Patient ID: Kathy Powell, female    DOB: March 15, 1971, 43 y.o.   MRN: [id number]  HPI Comments: Pain in both feet. Left foot is worse than the right. The right foot has muscle spasms and the left foot has really bad throbbing pain from the pinky toe to the heel. It is real sharp and hurts the worse with the first step down on it after rest, it has been hurting for almost two months now. Tried using tylenol pm , sleep with the left foot out from underneath the covers. Some days i can tolerate it and some days is horrible      Review of Systems  All other systems reviewed and are negative.      Objective:   Physical Exam: I have reviewed her past medical history medications allergies surgeries social history review of systems. Pulses are strongly palpable bilateral. Neurologic sensorium is intact per Semmes-Weinstein monofilament. Deep tendon reflexes are intact bilateral muscle strength is 5 over 5 dorsiflexors plantar flexors inverters everters all intrinsic musculature is intact. Orthopedic evaluation demonstrates all joints distal to the ankle a full range of motion without crepitation. She has mild pes planus bilateral. She has mild HAV deformities bilateral. She's pain on palpation medial calcaneal tubercle bilateral left greater than right. Radiographic evaluation demonstrates pes planus hallux August deformity as well as a soft tissue increase in density at the plantar fascial calcaneal insertion sites bilaterally. Cutaneous evaluation demonstrates supple well hydrated cutis no erythema edema cellulitis drainage or odor.        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 Hallux abductovalgus deformity bilateral. Pes planus bilateral.  Plan: Discussed etiology pathology conservative versus surgical therapies. She will start a Medrol Dosepak tomorrow to be followed by meloxicam which ER he has at  home. She was injected bilaterally today with Kenalog and local anesthetic to the point of maximal tenderness of the bilateral heels. Plantar fascial straps were provided and a night splint. Discussed appropriate shoe gear stretching exercises ice therapy and shoe gear modifications. Followup with her in one month.

## 2014-09-21 ENCOUNTER — Other Ambulatory Visit: Payer: Self-pay

## 2014-10-08 ENCOUNTER — Encounter: Payer: Self-pay | Admitting: Podiatry

## 2014-10-09 ENCOUNTER — Ambulatory Visit: Payer: 59 | Admitting: Podiatry

## 2014-10-16 ENCOUNTER — Ambulatory Visit (INDEPENDENT_AMBULATORY_CARE_PROVIDER_SITE_OTHER): Payer: 59 | Admitting: Podiatry

## 2014-10-16 VITALS — BP 129/85 | HR 87 | Resp 16

## 2014-10-16 DIAGNOSIS — M779 Enthesopathy, unspecified: Secondary | ICD-10-CM

## 2014-10-16 DIAGNOSIS — M7752 Other enthesopathy of left foot: Secondary | ICD-10-CM

## 2014-10-16 DIAGNOSIS — M778 Other enthesopathies, not elsewhere classified: Secondary | ICD-10-CM

## 2014-10-16 DIAGNOSIS — M722 Plantar fascial fibromatosis: Secondary | ICD-10-CM

## 2014-10-16 NOTE — Progress Notes (Signed)
She presents today for follow-up of her bilateral plantar fasciitis and lateral compensatory syndrome left foot.  Objective: Vital signs are stable she is alert and oriented 3. She has pain on palpation medial calcaneal tubercle left heel none on the right heel. She also has pain on palpation fifth metatarsal left foot. Pulses remain strongly palpable bilateral.  Assessment: Plantar fasciitis resolving right foot. Plantar fasciitis left foot with lateral compensatory syndrome and capsulitis fifth metatarsophalangeal joint left.  Plan: Reinjected the left heel today with Kenalog and local anesthetic and dexamethasone and local anesthetic to the fifth metatarsophalangeal joint left. Continue all other conservative therapies and I will follow-up with her in 1 month.

## 2014-11-13 ENCOUNTER — Ambulatory Visit: Payer: 59 | Admitting: Podiatry

## 2015-06-20 ENCOUNTER — Other Ambulatory Visit: Payer: Self-pay

## 2015-06-20 DIAGNOSIS — Z1231 Encounter for screening mammogram for malignant neoplasm of breast: Secondary | ICD-10-CM

## 2015-06-21 ENCOUNTER — Ambulatory Visit: Payer: Self-pay

## 2015-06-21 ENCOUNTER — Ambulatory Visit
Admission: RE | Admit: 2015-06-21 | Discharge: 2015-06-21 | Disposition: A | Discharge status: Home / Self Care | Payer: 59 | Source: Ambulatory Visit

## 2015-06-21 DIAGNOSIS — Z1231 Encounter for screening mammogram for malignant neoplasm of breast: Secondary | ICD-10-CM

## 2015-06-25 ENCOUNTER — Other Ambulatory Visit: Payer: Self-pay | Admitting: Family

## 2015-06-25 DIAGNOSIS — R928 Other abnormal and inconclusive findings on diagnostic imaging of breast: Secondary | ICD-10-CM

## 2015-07-01 ENCOUNTER — Ambulatory Visit
Admission: RE | Admit: 2015-07-01 | Discharge: 2015-07-01 | Disposition: A | Discharge status: Home / Self Care | Payer: 59 | Source: Ambulatory Visit | Attending: Family | Admitting: Family

## 2015-07-01 DIAGNOSIS — R928 Other abnormal and inconclusive findings on diagnostic imaging of breast: Secondary | ICD-10-CM

## 2017-06-24 ENCOUNTER — Encounter (HOSPITAL_COMMUNITY): Payer: Self-pay

## 2017-06-24 ENCOUNTER — Ambulatory Visit (HOSPITAL_COMMUNITY)
Admission: EM | Admit: 2017-06-24 | Discharge: 2017-06-24 | Disposition: A | Discharge status: Home / Self Care | Payer: 59 | Attending: Family Medicine | Admitting: Family Medicine

## 2017-06-24 DIAGNOSIS — R600 Localized edema: Secondary | ICD-10-CM | POA: Diagnosis not present

## 2017-06-24 MED ORDER — FUROSEMIDE 20 MG PO TABS: 20.0000 mg | ORAL_TABLET | Freq: Every day | ORAL | 0 refills | Status: DC

## 2017-06-24 NOTE — Discharge Instructions (Addendum)
Today you were diagnosed with the following: 1. Bilateral lower extremity edema    You have been prescribed prescription medications this visit.   Meds ordered this encounter  Medications          furosemide (LASIX) 20 MG tablet    Sig: Take 1 tablet (20 mg total) by mouth daily.    Dispense:  5 tablet    Refill:  0    Please take all medications as directed.  If you are not improving over the next few days or feel you are worsening please follow up here or the Emergency Department if you are unable to see your regular doctor.  Swelling happens when fluid collects in small spaces around tissues and organs inside the body. Another word for swelling is "edema." Some common parts of the body where people can have swelling are the lower legs or hands. This typically is worse in the areas of the body that are closest to the ground (because of gravity)  Symptoms of swelling can include puffiness of the skin, which can cause the skin to look stretched and shiny. This often occurs with swelling in the lower legs and can be worse after you sit or stand for a long time.  Treatment of edema includes several components: treatment of the underlying cause (if possible), reducing the amount of salt (sodium) in your diet, and, in many cases, use of a medication called a diuretic to eliminate excess fluid. Using compression stockings and elevating the legs may also be recommended.

## 2017-06-24 NOTE — ED Triage Notes (Signed)
Pt having edema in her right foot that she noticed today, said her left foot is a little swollen but not too much. Edema stops at her ankle. Said she hasn't had edema in 9 years.

## 2017-06-25 NOTE — ED Provider Notes (Signed)
  [place]   [id number] 06/24/17 Arrival Time: 1858  ASSESSMENT & PLAN:  Today you were diagnosed with the following: 1. Bilateral lower extremity edema    You have been prescribed prescription medications this visit.   Meds ordered this encounter  Medications  .       . furosemide (LASIX) 20 MG tablet    Sig: Take 1 tablet (20 mg total) by mouth daily.    Dispense:  5 tablet    Refill:  0    Please take all medications as directed. Recommend close f/u with PCP.  If you are not improving over the next few days or feel you are worsening please follow up here or the Emergency Department if you are unable to see your regular doctor.  Swelling happens when fluid collects in small spaces around tissues and organs inside the body. Another word for swelling is "edema." Some common parts of the body where people can have swelling are the lower legs or hands. This typically is worse in the areas of the body that are closest to the ground (because of gravity)  Symptoms of swelling can include puffiness of the skin, which can cause the skin to look stretched and shiny. This often occurs with swelling in the lower legs and can be worse after you sit or stand for a long time.  Treatment of edema includes several components: treatment of the underlying cause (if possible), reducing the amount of salt (sodium) in your diet, and, in many cases, use of a medication called a diuretic to eliminate excess fluid. Using compression stockings and elevating the legs may also be recommended.     Reviewed expectations re: course of current medical issues. Questions answered. Outlined signs and symptoms indicating need for more acute intervention. Patient verbalized understanding. After Visit Summary given.   SUBJECTIVE:  Kathy Powell is a 46 y.o. female who presents with complaint of bilat LE edema. Had after first pregnancy but resolved. Noticed yesterday. Both feet mainly but R > L.  Discomfort associated. No CP/SOB/hand or face swelling. No new medications. No frequent NSAID use. No recent prolonged travel. No abdominal symptoms. No self treatment. Ambulatory without problem.  ROS: As per HPI. All other systems negative.   OBJECTIVE:  Vitals:   06/24/17 1929  BP: (!) 148/85  Pulse: 74  Resp: 18  Temp: 98.7 F (37.1 C)  TempSrc: Oral  SpO2: 100%     General appearance: alert; no distress HEENT: normocephalic; atraumatic; conjunctivae normal; TMs normal; nasal mucosa normal; oral mucosa normal Neck: supple Lungs: clear to auscultation bilaterally Heart: regular rate and rhythm Abdomen: soft, non-tender; bowel sounds normal; no masses or organomegaly; no guarding or rebound tenderness Extremities: mild1+  bilat LE edema; R>L Skin: warm and dry Neurologic: normal symmetric reflexes; normal gait   Allergies  Allergen Reactions  . Aspirin Nausea And Vomiting    PMHx, SurgHx, SocialHx, Medications, and Allergies were reviewed in the Visit Navigator and updated as appropriate.      Vanessa Kick, MD 06/25/17 1302

## 2018-02-23 ENCOUNTER — Other Ambulatory Visit: Payer: Self-pay | Admitting: Family Medicine

## 2018-02-23 ENCOUNTER — Emergency Department (HOSPITAL_BASED_OUTPATIENT_CLINIC_OR_DEPARTMENT_OTHER): Payer: 59

## 2018-02-23 ENCOUNTER — Emergency Department (HOSPITAL_BASED_OUTPATIENT_CLINIC_OR_DEPARTMENT_OTHER)
Admission: EM | Admit: 2018-02-23 | Discharge: 2018-02-23 | Disposition: A | Discharge status: Home / Self Care | Payer: 59 | Attending: Emergency Medicine | Admitting: Emergency Medicine

## 2018-02-23 ENCOUNTER — Encounter (HOSPITAL_BASED_OUTPATIENT_CLINIC_OR_DEPARTMENT_OTHER): Payer: Self-pay

## 2018-02-23 ENCOUNTER — Other Ambulatory Visit: Payer: Self-pay

## 2018-02-23 DIAGNOSIS — Z87891 Personal history of nicotine dependence: Secondary | ICD-10-CM | POA: Diagnosis not present

## 2018-02-23 DIAGNOSIS — Z79899 Other long term (current) drug therapy: Secondary | ICD-10-CM | POA: Diagnosis not present

## 2018-02-23 DIAGNOSIS — K8020 Calculus of gallbladder without cholecystitis without obstruction: Secondary | ICD-10-CM

## 2018-02-23 DIAGNOSIS — I10 Essential (primary) hypertension: Secondary | ICD-10-CM | POA: Diagnosis not present

## 2018-02-23 DIAGNOSIS — R1010 Upper abdominal pain, unspecified: Secondary | ICD-10-CM

## 2018-02-23 DIAGNOSIS — N6310 Unspecified lump in the right breast, unspecified quadrant: Secondary | ICD-10-CM

## 2018-02-23 HISTORY — DX: Essential (primary) hypertension: I10

## 2018-02-23 LAB — URINALYSIS, ROUTINE W REFLEX MICROSCOPIC
Bilirubin Urine: NEGATIVE
Glucose, UA: NEGATIVE mg/dL
Hgb urine dipstick: NEGATIVE
KETONES UR: NEGATIVE mg/dL
LEUKOCYTES UA: NEGATIVE
NITRITE: POSITIVE — AB
PH: 6 (ref 5.0–8.0)
Protein, ur: NEGATIVE mg/dL
SPECIFIC GRAVITY, URINE: 1.025 (ref 1.005–1.030)

## 2018-02-23 LAB — COMPREHENSIVE METABOLIC PANEL
ALT: 22 U/L (ref 14–54)
ANION GAP: 9 (ref 5–15)
AST: 17 U/L (ref 15–41)
Albumin: 4.3 g/dL (ref 3.5–5.0)
Alkaline Phosphatase: 81 U/L (ref 38–126)
BILIRUBIN TOTAL: 0.4 mg/dL (ref 0.3–1.2)
BUN: 14 mg/dL (ref 6–20)
CO2: 21 mmol/L — ABNORMAL LOW (ref 22–32)
Calcium: 9.3 mg/dL (ref 8.9–10.3)
Chloride: 109 mmol/L (ref 101–111)
Creatinine, Ser: 1.19 mg/dL — ABNORMAL HIGH (ref 0.44–1.00)
GFR calc Af Amer: >60 mL/min (ref >60)
GFR, EST NON AFRICAN AMERICAN: 55 mL/min — AB (ref >60)
Glucose, Bld: 107 mg/dL — ABNORMAL HIGH (ref 65–99)
POTASSIUM: 3.7 mmol/L (ref 3.5–5.1)
Sodium: 139 mmol/L (ref 135–145)
TOTAL PROTEIN: 7.6 g/dL (ref 6.5–8.1)

## 2018-02-23 LAB — CBC
HCT: 42.7 % (ref 36.0–46.0)
HEMOGLOBIN: 14.7 g/dL (ref 12.0–15.0)
MCH: 31.1 pg (ref 26.0–34.0)
MCHC: 34.4 g/dL (ref 30.0–36.0)
MCV: 90.5 fL (ref 78.0–100.0)
Platelets: 171 10*3/uL (ref 150–400)
RBC: 4.72 MIL/uL (ref 3.87–5.11)
RDW: 13.7 % (ref 11.5–15.5)
WBC: 9.4 10*3/uL (ref 4.0–10.5)

## 2018-02-23 LAB — TROPONIN I

## 2018-02-23 LAB — PREGNANCY, URINE: PREG TEST UR: NEGATIVE

## 2018-02-23 LAB — LIPASE, BLOOD: Lipase: 70 U/L — ABNORMAL HIGH (ref 11–51)

## 2018-02-23 LAB — URINALYSIS, MICROSCOPIC (REFLEX): RBC / HPF: NONE SEEN RBC/hpf (ref 0–5)

## 2018-02-23 MED ORDER — HYDROCODONE-ACETAMINOPHEN 5-325 MG PO TABS: 2.0000 | ORAL_TABLET | ORAL | 0 refills | Status: DC | PRN

## 2018-02-23 MED ORDER — ONDANSETRON 4 MG PO TBDP: 4.0000 mg | ORAL_TABLET | Freq: Three times a day (TID) | ORAL | 0 refills | Status: DC | PRN

## 2018-02-23 MED ORDER — HYDROCODONE-ACETAMINOPHEN 5-325 MG PO TABS
1.0000 | ORAL_TABLET | Freq: Once | ORAL | Status: AC
  Administered 2018-02-23: 1 via ORAL
  Filled 2018-02-23 (×2): qty 1

## 2018-02-23 NOTE — ED Triage Notes (Signed)
C/o abd pain day 2-NAD-steady gait-was sent PCP after hours clinic

## 2018-02-23 NOTE — ED Provider Notes (Signed)
[facility] EMERGENCY DEPARTMENT Provider Note   CSN: [id number] Arrival date & time: 02/23/18  1921     History   Chief Complaint Chief Complaint  Patient presents with  . Abdominal Pain    HPI Kathy Powell is a 47 y.o. female.  HPI  47 year old female with a history of hypertension presents with upper abdominal pain.  Started yesterday afternoon.  Has been constant and worsening since.  She states it feels like a cramping and aching sensation.  She states she has it all the time but it is worse whenever she moves in any way or walks.  There is no chest pain or shortness of breath.  Occasionally will radiate inferiorly.  She has not been able to eat because of nausea but has not had vomiting.  She is not sure of abdominal pain specifically is worsened with eating.  No urinary or vaginal symptoms.  No back pain.  She has never had this before.  She went to see her PCP/urgent care and was given a GI cocktail that she states she thinks made it worse.  Past Medical History:  Diagnosis Date  . Abnormal Pap smear    10 years ago per pt   . Hypertension   . Migraine   . Yeast infection of the vagina     There are no active problems to display for this patient.   Past Surgical History:  Procedure Laterality Date  . CERCLAGE REMOVAL     insertion and removal   . TUBAL LIGATION      OB History    Gravida Para Term Preterm AB Living   6 4     2 4    SAB TAB Ectopic Multiple Live Births   2       4       Home Medications    Prior to Admission medications   Medication Sig Start Date End Date Taking? Authorizing Provider  HYDRALAZINE-HCTZ PO Take by mouth.   Yes [provider]  Varenicline Tartrate (CHANTIX PO) Take by mouth.   Yes [provider]  HYDROcodone-acetaminophen (NORCO) 5-325 MG tablet Take 2 tablets by mouth every 4 (four) hours as needed for severe pain. 02/23/18   Sherwood Gambler, MD  ondansetron (ZOFRAN ODT) 4 MG disintegrating  tablet Take 1 tablet (4 mg total) by mouth every 8 (eight) hours as needed. 02/23/18   Sherwood Gambler, MD  propranolol (INDERAL) 20 MG tablet Take 20 mg by mouth 3 (three) times daily.    [provider]  SUMAtriptan (IMITREX) 50 MG tablet Take 50 mg by mouth every 2 (two) hours as needed for migraine or headache. May repeat in 2 hours if headache persists or recurs.    [provider]  topiramate (TOPAMAX) 100 MG tablet Take 100 mg by mouth 2 (two) times daily.    [provider]    Family History Family History  Problem Relation Age of Onset  . Cancer Paternal Grandmother        lung  . Cancer Maternal Grandmother        lung  . Stroke Maternal Grandmother   . Hypertension Mother   . Asthma Son   . Thyroid disease Maternal Aunt   . Hypertension Maternal Aunt     Social History Social History   Tobacco Use  . Smoking status: Former Smoker    Last attempt to quit: 12/07/2017    Years since quitting: 0.2  . Smokeless tobacco: Never Used  Substance Use Topics  . Alcohol use: Yes    Comment: occ  . Drug use: No     Allergies   Aspirin   Review of Systems Review of Systems  Constitutional: Negative for fever.  Respiratory: Negative for shortness of breath.   Cardiovascular: Negative for chest pain.  Gastrointestinal: Positive for abdominal pain, diarrhea and nausea. Negative for vomiting.  Genitourinary: Negative for dysuria and vaginal bleeding.  Musculoskeletal: Negative for back pain.  All other systems reviewed and are negative.    Physical Exam Updated Vital Signs BP 122/83   Pulse 80   Temp 97.8 F (36.6 C) (Oral)   Resp 14   Ht 5\' 6"  (1.676 m)   Wt 108.4 kg (239 lb)   LMP 01/12/2018   SpO2 99%   BMI 38.58 kg/m   Physical Exam  Constitutional: She is oriented to person, place, and time. She appears well-developed and well-nourished.  Non-toxic appearance. She does not appear ill. No distress.  obese  HENT:  Head:  Normocephalic and atraumatic.  Right Ear: External ear normal.  Left Ear: External ear normal.  Nose: Nose normal.  Eyes: Right eye exhibits no discharge. Left eye exhibits no discharge.  Cardiovascular: Normal rate, regular rhythm and normal heart sounds.  Pulmonary/Chest: Effort normal and breath sounds normal.  Abdominal: Soft. There is tenderness (worst in epigastrum/LUQ) in the right upper quadrant, epigastric area and left upper quadrant. There is negative Murphy's sign.  Neurological: She is alert and oriented to person, place, and time.  Skin: Skin is warm and dry.  Nursing note and vitals reviewed.    ED Treatments / Results  Labs (all labs ordered are listed, but only abnormal results are displayed) Labs Reviewed  LIPASE, BLOOD - Abnormal; Notable for the following components:      Result Value   Lipase 70 (*)    All other components within normal limits  COMPREHENSIVE METABOLIC PANEL - Abnormal; Notable for the following components:   CO2 21 (*)    Glucose, Bld 107 (*)    Creatinine, Ser 1.19 (*)    GFR calc non Af Amer 55 (*)    All other components within normal limits  URINALYSIS, ROUTINE W REFLEX MICROSCOPIC - Abnormal; Notable for the following components:   APPearance CLOUDY (*)    Nitrite POSITIVE (*)    All other components within normal limits  URINALYSIS, MICROSCOPIC (REFLEX) - Abnormal; Notable for the following components:   Bacteria, UA MANY (*)    Squamous Epithelial / LPF 0-5 (*)    All other components within normal limits  CBC  PREGNANCY, URINE  TROPONIN I    EKG  EKG Interpretation  Date/Time:  Wednesday February 23 2018 20:51:01 EDT Ventricular Rate:  80 PR Interval:    QRS Duration: 97 QT Interval:  437 QTC Calculation: 505 R Axis:   11 Text Interpretation:  Sinus rhythm Borderline prolonged QT interval no acute ST/T changes no significant change since Dec 2008 Confirmed by Sherwood Gambler [id number]) on 02/23/2018 8:58:35 PM        Radiology US Abdomen Limited Ruq  Result Date: 02/23/2018 CLINICAL DATA:  Right upper quadrant and epigastric pain x2 days. EXAM: ULTRASOUND ABDOMEN LIMITED RIGHT UPPER QUADRANT COMPARISON:  None. FINDINGS: Gallbladder: Numerous dependent gallstones are seen within the gallbladder, the largest approximately 7 mm. Biliary sludge is also present within. Gallbladder wall is not thickened measuring 2 mm single wall thickness. No pericholecystic fluid is noted. Technologist notes that the patient  was noted to be uncomfortable when assessing for sonographic Murphy's but not overtly so. Common bile duct: Diameter: 1 mm Liver: No focal lesion identified. Increased echogenicity. Portal vein is patent on color Doppler imaging with normal direction of blood flow towards the liver. IMPRESSION: 1. Cholelithiasis with biliary sludge but without additional signs of acute cholecystitis. Consider HIDA scan for further assessment as deemed clinically necessary. 2. Mild fatty infiltration of the liver. Electronically Signed   By: Ashley Royalty M.D.   On: 02/23/2018 21:50    Procedures Procedures (including critical care time)  Medications Ordered in ED Medications  HYDROcodone-acetaminophen (NORCO/VICODIN) 5-325 MG per tablet 1 tablet (1 tablet Oral Given 02/23/18 2255)     Initial Impression / Assessment and Plan / ED Course  I have reviewed the triage vital signs and the nursing notes.  Pertinent labs & imaging results that were available during my care of the patient were reviewed by me and considered in my medical decision making (see chart for details).     Patient initially declined pain medicine in the ED.  Workup shows multiple gallstones but no evidence of cholecystitis.  She has no fever, normal WBC, and normal LFTs.  She does have a small bump in lipase but not consistent with pancreatitis.  I do not think CT imaging would be beneficial.  Given relative pain control I think she is stable for  discharge home.  She will need outpatient general surgery follow-up.  We discussed strict return precautions.  She will be given hydrocodone here as well as at prescription for home with Zofran.  Final Clinical Impressions(s) / ED Diagnoses   Final diagnoses:  Upper abdominal pain  Calculus of gallbladder without cholecystitis without obstruction    ED Discharge Orders        Ordered    HYDROcodone-acetaminophen (NORCO) 5-325 MG tablet  Every 4 hours PRN     02/23/18 2228    ondansetron (ZOFRAN ODT) 4 MG disintegrating tablet  Every 8 hours PRN     02/23/18 2228       Sherwood Gambler, MD 02/23/18 2333

## 2018-03-02 ENCOUNTER — Ambulatory Visit
Admission: RE | Admit: 2018-03-02 | Discharge: 2018-03-02 | Disposition: A | Discharge status: Home / Self Care | Payer: 59 | Source: Ambulatory Visit | Attending: Family Medicine | Admitting: Family Medicine

## 2018-03-02 ENCOUNTER — Ambulatory Visit: Payer: 59

## 2018-03-02 DIAGNOSIS — N6310 Unspecified lump in the right breast, unspecified quadrant: Secondary | ICD-10-CM

## 2018-03-09 ENCOUNTER — Ambulatory Visit: Payer: Self-pay | Admitting: Surgery

## 2018-03-09 NOTE — H&P (View-Only) (Signed)
[name] Documented: 03/09/2018 9:38 AM Location: [facility] Patient #: [id number] DOB: October 24, 1971 Married / Language: Undefined / Race: Black or African American Female  History of Present Illness (Kathy Powell A. [name] MD; 03/09/2018 10:00 AM) Patient words: A 47 year old woman referred by ER biliary colic. She presented there on March 20 with about 24 hours of upper abdominal pain associated with nausea and anorexia. Pain radiated to bilateral subcostal margins. Cramping and aching in quality. Exacerbated by movement. No fevers. No associated shortness of breath or chest pain. Prior to this she had no similar symptoms. A GI cocktail was not effective. CBC and CMP were unremarkable. Her upper quadrant ultrasound demonstrated multiple gallstones but no signs of cholecystitis, normal common bile duct. She was discharged with return precautions and over the next few days the pain did resolve. She did not have any further issues until last night when the pain did recur. She notes that she started keto diet in January and has lost about 25lb.  The patient is a 47 year old female.   Past Surgical History Kathy Powell, [place]; 03/09/2018 9:38 AM) Oral Surgery  Diagnostic Studies History Kathy Powell, CMA; 03/09/2018 9:38 AM) Colonoscopy never Mammogram within last year Pap Smear 1-5 years ago  Allergies Kathy Powell, CMA; 03/09/2018 9:39 AM) Aspirin *ANALGESICS - NonNarcotic* Allergies Reconciled  Medication History Kathy Powell, CMA; 03/09/2018 9:40 AM) Hydrocodone-Acetaminophen (5-325MG  Tablet, Oral) Active. HydroCHLOROthiazide (25MG  Tablet, Oral) Active. Topiramate (100MG  Tablet, Oral) Active. Propranolol HCl (40MG  Tablet, Oral) Active. Imitrex (100MG  Tablet, Oral) Active. Medications Reconciled  Social History Kathy Powell Education officer, museum, CMA; 03/09/2018 9:38 AM) Alcohol use Occasional alcohol use. Caffeine use Coffee. Illicit drug use  Remotely quit drug use. Tobacco use Current some day smoker.  Family History Kathy Powell, [place]; 03/09/2018 9:38 AM) Alcohol Abuse Father. Arthritis Mother. Bleeding disorder Father. Depression Father. Heart Disease Father. Hypertension Father.  Pregnancy / Birth History Kathy Powell, CMA; 03/09/2018 9:38 AM) Age at menarche 66 years. Gravida 6 Length (months) of breastfeeding 7-12 Maternal age 61-20 Para 5 Regular periods  Other Problems Kathy Powell, CMA; 03/09/2018 9:38 AM) Back Pain Gastroesophageal Reflux Disease High blood pressure Migraine Headache     Review of Systems Kathy Powell [name]; 03/09/2018 9:38 AM) General Present- Fatigue. Not Present- Appetite Loss, Chills, Fever, Night Sweats, Weight Gain and Weight Loss. Skin Not Present- Change in Wart/Mole, Dryness, Hives, Jaundice, New Lesions, Non-Healing Wounds, Rash and Ulcer. HEENT Present- Wears glasses/contact lenses. Not Present- Earache, Hearing Loss, Hoarseness, Nose Bleed, Oral Ulcers, Ringing in the Ears, Seasonal Allergies, Sinus Pain, Sore Throat, Visual Disturbances and Yellow Eyes. Respiratory Not Present- Bloody sputum, Chronic Cough, Difficulty Breathing, Snoring and Wheezing. Breast Not Present- Breast Mass, Breast Pain, Nipple Discharge and Skin Changes. Cardiovascular Not Present- Chest Pain, Difficulty Breathing Lying Down, Leg Cramps, Palpitations, Rapid Heart Rate, Shortness of Breath and Swelling of Extremities. Gastrointestinal Present- Abdominal Pain and Bloating. Not Present- Bloody Stool, Change in Bowel Habits, Chronic diarrhea, Constipation, Difficulty Swallowing, Excessive gas, Gets full quickly at meals, Hemorrhoids, Indigestion, Nausea, Rectal Pain and Vomiting. Female Genitourinary Not Present- Frequency, Nocturia, Painful Urination, Pelvic Pain and Urgency. Musculoskeletal Present- Back Pain. Not Present- Joint Pain, Joint Stiffness, Muscle Pain, Muscle  Weakness and Swelling of Extremities. Neurological Present- Headaches. Not Present- Decreased Memory, Fainting, Numbness, Seizures, Tingling, Tremor, Trouble walking and Weakness. Psychiatric Not Present- Anxiety, Bipolar, Change in Sleep Pattern, Depression, Fearful and Frequent crying. Endocrine Not Present- Cold Intolerance, Excessive Hunger, Hair Changes, Heat Intolerance, Hot flashes and New  Diabetes. Hematology Not Present- Blood Thinners, Easy Bruising, Excessive bleeding, Gland problems, HIV and Persistent Infections.  Vitals Kathy Powell [name]; 03/09/2018 9:40 AM) 03/09/2018 9:40 AM Weight: 243 lb Height: 66in Body Surface Area: 2.17 m Body Mass Index: 39.22 kg/m  Temp.: 69F(Tympanic)  Pulse: 67 (Regular)  BP: 122/84 (Sitting, Right Arm, Standard)      Physical Exam ([name] A. [name] MD; 03/09/2018 10:01 AM)  The physical exam findings are as follows: Note:Gen: alert and well appearing Eye: extraocular motion intact, no scleral icterus ENT: moist mucus membranes, dentition intact Neck: no mass or thyromegaly Chest: unlabored respirations, symmetrical air entry, clear bilaterally CV: regular rate and rhythm, no pedal edema Abdomen: soft, tender epigastrium and RUQ, nondistended. No mass or organomegaly MSK: strength symmetrical throughout, no deformity Neuro: grossly intact, normal gait Psych: normal mood and affect, appropriate insight Skin: warm and dry, no rash or lesion on limited exam    Assessment & Plan ([name] A. [name] MD; 03/09/2018 99:24 AM)  BILIARY COLIC (Q68.34) Story: She has classic symptoms and an ultrasound done showing gallstones. I recommended laparoscopic cholecystectomy I discussed with her the technique of the operation, the risks of bleeding, infection, pain, scarring, intra-abdominal injury specifically to the common bile duct and sequelae, conversion to open surgery, as well as general risks of anesthesia. Questions were welcomed  and answered. We'll get her scheduled for surgery.

## 2018-03-09 NOTE — H&P (Signed)
Kathy Powell Documented: 03/09/2018 9:38 AM Location: [facility] Patient #: [id number] DOB: Sep 23, 1971 Married / Language: Undefined / Race: Black or African American Female  History of Present Illness (Kathy Worlds A. [name] MD; 03/09/2018 10:00 AM) Patient words: A 47 year old woman referred by ER biliary colic. She presented there on March 20 with about 24 hours of upper abdominal pain associated with nausea and anorexia. Pain radiated to bilateral subcostal margins. Cramping and aching in quality. Exacerbated by movement. No fevers. No associated shortness of breath or chest pain. Prior to this she had no similar symptoms. A GI cocktail was not effective. CBC and CMP were unremarkable. Her upper quadrant ultrasound demonstrated multiple gallstones but no signs of cholecystitis, normal common bile duct. She was discharged with return precautions and over the next few days the pain did resolve. She did not have any further issues until last night when the pain did recur. She notes that she started keto diet in January and has lost about 25lb.  The patient is a 47 year old female.   Past Surgical History Kathy Powell, [place]; 03/09/2018 9:38 AM) Oral Surgery  Diagnostic Studies History Kathy Powell, CMA; 03/09/2018 9:38 AM) Colonoscopy never Mammogram within last year Pap Smear 1-5 years ago  Allergies Kathy Powell, CMA; 03/09/2018 9:39 AM) Aspirin *ANALGESICS - NonNarcotic* Allergies Reconciled  Medication History Kathy Powell, CMA; 03/09/2018 9:40 AM) Hydrocodone-Acetaminophen (5-325MG  Tablet, Oral) Active. HydroCHLOROthiazide (25MG  Tablet, Oral) Active. Topiramate (100MG  Tablet, Oral) Active. Propranolol HCl (40MG  Tablet, Oral) Active. Imitrex (100MG  Tablet, Oral) Active. Medications Reconciled  Social History Kathy Powell Education officer, museum, CMA; 03/09/2018 9:38 AM) Alcohol use Occasional alcohol use. Caffeine use Coffee. Illicit drug use  Remotely quit drug use. Tobacco use Current some day smoker.  Family History Kathy Powell, Kathy Powell; 03/09/2018 9:38 AM) Alcohol Abuse Father. Arthritis Mother. Bleeding disorder Father. Depression Father. Heart Disease Father. Hypertension Father.  Pregnancy / Birth History Kathy Powell, CMA; 03/09/2018 9:38 AM) Age at menarche 42 years. Gravida 6 Length (months) of breastfeeding 7-12 Maternal age 27-20 Para 5 Regular periods  Other Problems Kathy Powell, CMA; 03/09/2018 9:38 AM) Back Pain Gastroesophageal Reflux Disease High blood pressure Migraine Headache     Review of Systems Kathy Powell Kathy Powell CMA; 03/09/2018 9:38 AM) General Present- Fatigue. Not Present- Appetite Loss, Chills, Fever, Night Sweats, Weight Gain and Weight Loss. Skin Not Present- Change in Wart/Mole, Dryness, Hives, Jaundice, New Lesions, Non-Healing Wounds, Rash and Ulcer. HEENT Present- Wears glasses/contact lenses. Not Present- Earache, Hearing Loss, Hoarseness, Nose Bleed, Oral Ulcers, Ringing in the Ears, Seasonal Allergies, Sinus Pain, Sore Throat, Visual Disturbances and Yellow Eyes. Respiratory Not Present- Bloody sputum, Chronic Cough, Difficulty Breathing, Snoring and Wheezing. Breast Not Present- Breast Mass, Breast Pain, Nipple Discharge and Skin Changes. Cardiovascular Not Present- Chest Pain, Difficulty Breathing Lying Down, Leg Cramps, Palpitations, Rapid Heart Rate, Shortness of Breath and Swelling of Extremities. Gastrointestinal Present- Abdominal Pain and Bloating. Not Present- Bloody Stool, Change in Bowel Habits, Chronic diarrhea, Constipation, Difficulty Swallowing, Excessive gas, Gets full quickly at meals, Hemorrhoids, Indigestion, Nausea, Rectal Pain and Vomiting. Female Genitourinary Not Present- Frequency, Nocturia, Painful Urination, Pelvic Pain and Urgency. Musculoskeletal Present- Back Pain. Not Present- Joint Pain, Joint Stiffness, Muscle Pain, Muscle  Weakness and Swelling of Extremities. Neurological Present- Headaches. Not Present- Decreased Memory, Fainting, Numbness, Seizures, Tingling, Tremor, Trouble walking and Weakness. Psychiatric Not Present- Anxiety, Bipolar, Change in Sleep Pattern, Depression, Fearful and Frequent crying. Endocrine Not Present- Cold Intolerance, Excessive Hunger, Hair Changes, Heat Intolerance, Hot flashes and New  Diabetes. Hematology Not Present- Blood Thinners, Easy Bruising, Excessive bleeding, Gland problems, HIV and Persistent Infections.  Vitals Kathy Powell Kathy Powell CMA; 03/09/2018 9:40 AM) 03/09/2018 9:40 AM Weight: 243 lb Height: 66in Body Surface Area: 2.17 m Body Mass Index: 39.22 kg/m  Temp.: 36F(Tympanic)  Pulse: 67 (Regular)  BP: 122/84 (Sitting, Right Arm, Standard)      Physical Exam (Kathy Powell A. [name] MD; 03/09/2018 10:01 AM)  The physical exam findings are as follows: Note:Gen: alert and well appearing Eye: extraocular motion intact, no scleral icterus ENT: moist mucus membranes, dentition intact Neck: no mass or thyromegaly Chest: unlabored respirations, symmetrical air entry, clear bilaterally CV: regular rate and rhythm, no pedal edema Abdomen: soft, tender epigastrium and RUQ, nondistended. No mass or organomegaly MSK: strength symmetrical throughout, no deformity Neuro: grossly intact, normal gait Psych: normal mood and affect, appropriate insight Skin: warm and dry, no rash or lesion on limited exam    Assessment & Plan (Kathy Powell A. [name] MD; 03/09/2018 09:62 AM)  BILIARY COLIC (E36.62) Story: She has classic symptoms and an ultrasound done showing gallstones. I recommended laparoscopic cholecystectomy I discussed with her the technique of the operation, the risks of bleeding, infection, pain, scarring, intra-abdominal injury specifically to the common bile duct and sequelae, conversion to open surgery, as well as general risks of anesthesia. Questions were welcomed  and answered. We'll get her scheduled for surgery.

## 2018-03-22 NOTE — Pre-Procedure Instructions (Signed)
Kathy Powell  03/22/2018      [id number] - [place], [address] AT [address] [place] [address] Phone: [phone] Fax: [phone]    Your procedure is scheduled on April 24  Report to [place] at [place].M.  Call this number if you have problems the morning of surgery:  [phone]   Remember:  Do not eat food or drink liquids after midnight. Continue all medications as directed by your physician except follow these medication instructions before surgery below   Take these medicines the morning of surgery with A SIP OF WATER  HYDROcodone-acetaminophen (NORCO) propranolol (INDERAL) topiramate (TOPAMAX)  7 days prior to surgery STOP taking any Aspirin(unless otherwise instructed by your surgeon), Aleve, Naproxen, Ibuprofen, Motrin, Advil, Goody's, BC's, all herbal medications, fish oil, and all vitamins   Do not wear jewelry, make-up or nail polish.  Do not wear lotions, powders, or perfumes, or deodorant.  Do not shave 48 hours prior to surgery.    Do not bring valuables to the hospital.  [facility] is not responsible for any belongings or valuables.  Contacts, dentures or bridgework may not be worn into surgery.  Leave your suitcase in the car.  After surgery it may be brought to your room.  For patients admitted to the hospital, discharge time will be determined by your treatment team.  Patients discharged the day of surgery will not be allowed to drive home.    Special instructions:   [place]- Preparing For Surgery  Before surgery, you can play an important role. Because skin is not sterile, your skin needs to be as free of germs as possible. You can reduce the number of germs on your skin by washing with CHG (chlorahexidine gluconate) Soap before surgery.  CHG is an antiseptic cleaner which kills germs and bonds with the skin to continue killing germs  even after washing.  Please do not use if you have an allergy to CHG or antibacterial soaps. If your skin becomes reddened/irritated stop using the CHG.  Do not shave (including legs and underarms) for at least 48 hours prior to first CHG shower. It is OK to shave your face.  Please follow these instructions carefully.   1. Shower the NIGHT BEFORE SURGERY and the MORNING OF SURGERY with CHG.   2. If you chose to wash your hair, wash your hair first as usual with your normal shampoo.  3. After you shampoo, rinse your hair and body thoroughly to remove the shampoo.  4. Use CHG as you would any other liquid soap. You can apply CHG directly to the skin and wash gently with a scrungie or a clean washcloth.   5. Apply the CHG Soap to your body ONLY FROM THE NECK DOWN.  Do not use on open wounds or open sores. Avoid contact with your eyes, ears, mouth and genitals (private parts). Wash Face and genitals (private parts)  with your normal soap.  6. Wash thoroughly, paying special attention to the area where your surgery will be performed.  7. Thoroughly rinse your body with warm water from the neck down.  8. DO NOT shower/wash with your normal soap after using and rinsing off the CHG Soap.  9. Pat yourself dry with a CLEAN TOWEL.  10. Wear CLEAN PAJAMAS to bed the night before surgery, wear comfortable clothes the morning of surgery  11. Place CLEAN SHEETS on  your bed the night of your first shower and DO NOT SLEEP WITH PETS.    Day of Surgery: Do not apply any deodorants/lotions. Please wear clean clothes to the hospital/surgery center.      Please read over the following fact sheets that you were given.

## 2018-03-23 ENCOUNTER — Encounter (HOSPITAL_COMMUNITY): Payer: Self-pay

## 2018-03-23 ENCOUNTER — Other Ambulatory Visit (HOSPITAL_COMMUNITY): Payer: 59

## 2018-03-23 ENCOUNTER — Other Ambulatory Visit: Payer: Self-pay

## 2018-03-23 ENCOUNTER — Encounter (HOSPITAL_COMMUNITY)
Admission: RE | Admit: 2018-03-23 | Discharge: 2018-03-23 | Disposition: A | Discharge status: Home / Self Care | Payer: 59 | Source: Ambulatory Visit | Attending: Surgery | Admitting: Surgery

## 2018-03-23 DIAGNOSIS — Z01812 Encounter for preprocedural laboratory examination: Secondary | ICD-10-CM | POA: Diagnosis not present

## 2018-03-23 LAB — BASIC METABOLIC PANEL
ANION GAP: 10 (ref 5–15)
BUN: 15 mg/dL (ref 6–20)
CALCIUM: 9.4 mg/dL (ref 8.9–10.3)
CO2: 21 mmol/L — AB (ref 22–32)
CREATININE: 1.23 mg/dL — AB (ref 0.44–1.00)
Chloride: 107 mmol/L (ref 101–111)
GFR, EST NON AFRICAN AMERICAN: 52 mL/min — AB (ref >60)
Glucose, Bld: 114 mg/dL — ABNORMAL HIGH (ref 65–99)
Potassium: 3.6 mmol/L (ref 3.5–5.1)
SODIUM: 138 mmol/L (ref 135–145)

## 2018-03-23 LAB — CBC
HCT: 42.4 % (ref 36.0–46.0)
HEMOGLOBIN: 14.5 g/dL (ref 12.0–15.0)
MCH: 30.9 pg (ref 26.0–34.0)
MCHC: 34.2 g/dL (ref 30.0–36.0)
MCV: 90.4 fL (ref 78.0–100.0)
PLATELETS: 189 10*3/uL (ref 150–400)
RBC: 4.69 MIL/uL (ref 3.87–5.11)
RDW: 13.7 % (ref 11.5–15.5)
WBC: 7.5 10*3/uL (ref 4.0–10.5)

## 2018-03-23 NOTE — Progress Notes (Signed)
PCP - [place] Cardiologist - denies  Chest x-ray - not needed EKG - 02/23/18 Stress Test - denies ECHO - denies Cardiac Cath - denies    Anesthesia review: No  Patient denies shortness of breath, fever, cough and chest pain at PAT appointment   Patient verbalized understanding of instructions that were given to them at the PAT appointment. Patient was also instructed that they will need to review over the PAT instructions again at home before surgery.

## 2018-03-29 NOTE — Anesthesia Preprocedure Evaluation (Addendum)
Anesthesia Evaluation  Patient identified by MRN, date of birth, ID band Patient awake    Reviewed: Allergy & Precautions, H&P , NPO status , Patient's Chart, lab work & pertinent test results, reviewed documented beta blocker date and time   Airway Mallampati: II  TM Distance: >3 FB Neck ROM: full    Dental no notable dental hx. (+) Partial Upper, Poor Dentition, Missing,    Pulmonary neg pulmonary ROS, former smoker,    Pulmonary exam normal breath sounds clear to auscultation       Cardiovascular Exercise Tolerance: Good hypertension, Pt. on medications and Pt. on home beta blockers  Rhythm:regular Rate:Normal     Neuro/Psych  Headaches, negative psych ROS   GI/Hepatic negative GI ROS, Neg liver ROS,   Endo/Other  negative endocrine ROS  Renal/GU negative Renal ROS  negative genitourinary   Musculoskeletal   Abdominal   Peds  Hematology negative hematology ROS (+)   Anesthesia Other Findings   Reproductive/Obstetrics negative OB ROS                           Anesthesia Physical Anesthesia Plan  ASA: II  Anesthesia Plan: General   Post-op Pain Management:    Induction:   PONV Risk Score and Plan: 2 and Treatment may vary due to age or medical condition, Ondansetron and Dexamethasone  Airway Management Planned: Oral ETT  Additional Equipment:   Intra-op Plan:   Post-operative Plan:   Informed Consent: I have reviewed the patients History and Physical, chart, labs and discussed the procedure including the risks, benefits and alternatives for the proposed anesthesia with the patient or authorized representative who has indicated his/her understanding and acceptance.   Dental Advisory Given  Plan Discussed with: CRNA, Anesthesiologist and Surgeon  Anesthesia Plan Comments:        Anesthesia Quick Evaluation

## 2018-03-30 ENCOUNTER — Encounter (HOSPITAL_COMMUNITY): Payer: Self-pay

## 2018-03-30 ENCOUNTER — Other Ambulatory Visit: Payer: Self-pay

## 2018-03-30 ENCOUNTER — Ambulatory Visit (HOSPITAL_COMMUNITY): Payer: 59 | Admitting: Certified Registered"

## 2018-03-30 ENCOUNTER — Encounter (HOSPITAL_COMMUNITY)
Admission: RE | Disposition: A | Discharge status: Home / Self Care | Payer: Self-pay | Source: Ambulatory Visit | Attending: Surgery

## 2018-03-30 ENCOUNTER — Ambulatory Visit (HOSPITAL_COMMUNITY)
Admission: RE | Admit: 2018-03-30 | Discharge: 2018-03-30 | Disposition: A | Discharge status: Home / Self Care | Payer: 59 | Source: Ambulatory Visit | Attending: Surgery | Admitting: Surgery

## 2018-03-30 DIAGNOSIS — Z8261 Family history of arthritis: Secondary | ICD-10-CM | POA: Diagnosis not present

## 2018-03-30 DIAGNOSIS — Z8249 Family history of ischemic heart disease and other diseases of the circulatory system: Secondary | ICD-10-CM | POA: Diagnosis not present

## 2018-03-30 DIAGNOSIS — F17200 Nicotine dependence, unspecified, uncomplicated: Secondary | ICD-10-CM | POA: Diagnosis not present

## 2018-03-30 DIAGNOSIS — Z811 Family history of alcohol abuse and dependence: Secondary | ICD-10-CM | POA: Diagnosis not present

## 2018-03-30 DIAGNOSIS — K8064 Calculus of gallbladder and bile duct with chronic cholecystitis without obstruction: Secondary | ICD-10-CM | POA: Diagnosis present

## 2018-03-30 DIAGNOSIS — G43909 Migraine, unspecified, not intractable, without status migrainosus: Secondary | ICD-10-CM | POA: Insufficient documentation

## 2018-03-30 DIAGNOSIS — K219 Gastro-esophageal reflux disease without esophagitis: Secondary | ICD-10-CM | POA: Diagnosis not present

## 2018-03-30 DIAGNOSIS — Z832 Family history of diseases of the blood and blood-forming organs and certain disorders involving the immune mechanism: Secondary | ICD-10-CM | POA: Diagnosis not present

## 2018-03-30 DIAGNOSIS — M549 Dorsalgia, unspecified: Secondary | ICD-10-CM | POA: Diagnosis not present

## 2018-03-30 DIAGNOSIS — K8010 Calculus of gallbladder with chronic cholecystitis without obstruction: Secondary | ICD-10-CM | POA: Diagnosis not present

## 2018-03-30 DIAGNOSIS — Z818 Family history of other mental and behavioral disorders: Secondary | ICD-10-CM | POA: Diagnosis not present

## 2018-03-30 DIAGNOSIS — I10 Essential (primary) hypertension: Secondary | ICD-10-CM | POA: Insufficient documentation

## 2018-03-30 DIAGNOSIS — Z79899 Other long term (current) drug therapy: Secondary | ICD-10-CM | POA: Diagnosis not present

## 2018-03-30 DIAGNOSIS — Z886 Allergy status to analgesic agent status: Secondary | ICD-10-CM | POA: Insufficient documentation

## 2018-03-30 HISTORY — PX: CHOLECYSTECTOMY: SHX55

## 2018-03-30 HISTORY — DX: Calculus of bile duct without cholangitis or cholecystitis without obstruction: K80.50

## 2018-03-30 LAB — POCT PREGNANCY, URINE: Preg Test, Ur: NEGATIVE

## 2018-03-30 SURGERY — LAPAROSCOPIC CHOLECYSTECTOMY
Anesthesia: General | Site: Abdomen

## 2018-03-30 MED ORDER — CEFAZOLIN SODIUM-DEXTROSE 2-4 GM/100ML-% IV SOLN
2.0000 g | INTRAVENOUS | Status: AC
  Administered 2018-03-30: 2 g via INTRAVENOUS
  Filled 2018-03-30: qty 100

## 2018-03-30 MED ORDER — ACETAMINOPHEN 650 MG RE SUPP: 650.0000 mg | RECTAL | Status: DC | PRN

## 2018-03-30 MED ORDER — GABAPENTIN 300 MG PO CAPS
300.0000 mg | ORAL_CAPSULE | ORAL | Status: AC
  Administered 2018-03-30: 300 mg via ORAL
  Filled 2018-03-30: qty 1

## 2018-03-30 MED ORDER — PROPOFOL 10 MG/ML IV BOLUS
INTRAVENOUS | Status: AC
  Filled 2018-03-30: qty 40

## 2018-03-30 MED ORDER — MIDAZOLAM HCL 2 MG/2ML IJ SOLN
INTRAMUSCULAR | Status: AC
  Filled 2018-03-30: qty 2

## 2018-03-30 MED ORDER — GLYCOPYRROLATE 0.2 MG/ML IV SOSY
PREFILLED_SYRINGE | INTRAVENOUS | Status: DC | PRN
  Administered 2018-03-30: .2 mg via INTRAVENOUS

## 2018-03-30 MED ORDER — PROPOFOL 10 MG/ML IV BOLUS
INTRAVENOUS | Status: DC | PRN
  Administered 2018-03-30: 160 mg via INTRAVENOUS
  Administered 2018-03-30: 40 mg via INTRAVENOUS

## 2018-03-30 MED ORDER — SODIUM CHLORIDE 0.9% FLUSH: 3.0000 mL | INTRAVENOUS | Status: DC | PRN

## 2018-03-30 MED ORDER — SODIUM CHLORIDE 0.9% FLUSH: 3.0000 mL | Freq: Two times a day (BID) | INTRAVENOUS | Status: DC

## 2018-03-30 MED ORDER — FENTANYL CITRATE (PF) 100 MCG/2ML IJ SOLN
INTRAMUSCULAR | Status: AC
  Filled 2018-03-30: qty 2

## 2018-03-30 MED ORDER — SODIUM CHLORIDE 0.9 % IR SOLN
Status: DC | PRN
  Administered 2018-03-30: 1000 mL

## 2018-03-30 MED ORDER — ACETAMINOPHEN 500 MG PO TABS
1000.0000 mg | ORAL_TABLET | ORAL | Status: AC
  Administered 2018-03-30: 1000 mg via ORAL
  Filled 2018-03-30: qty 2

## 2018-03-30 MED ORDER — CHLORHEXIDINE GLUCONATE 4 % EX LIQD: 60.0000 mL | Freq: Once | CUTANEOUS | Status: DC

## 2018-03-30 MED ORDER — ONDANSETRON HCL 4 MG/2ML IJ SOLN
INTRAMUSCULAR | Status: DC | PRN
  Administered 2018-03-30: 4 mg via INTRAVENOUS

## 2018-03-30 MED ORDER — ACETAMINOPHEN 325 MG PO TABS: 650.0000 mg | ORAL_TABLET | ORAL | Status: DC | PRN

## 2018-03-30 MED ORDER — ACETAMINOPHEN 325 MG PO TABS: 325.0000 mg | ORAL_TABLET | ORAL | Status: DC | PRN

## 2018-03-30 MED ORDER — SODIUM CHLORIDE 0.9 % IV SOLN: 250.0000 mL | INTRAVENOUS | Status: DC | PRN

## 2018-03-30 MED ORDER — FENTANYL CITRATE (PF) 250 MCG/5ML IJ SOLN
INTRAMUSCULAR | Status: DC | PRN
  Administered 2018-03-30: 50 ug via INTRAVENOUS
  Administered 2018-03-30: 100 ug via INTRAVENOUS

## 2018-03-30 MED ORDER — 0.9 % SODIUM CHLORIDE (POUR BTL) OPTIME
TOPICAL | Status: DC | PRN
  Administered 2018-03-30: 1000 mL

## 2018-03-30 MED ORDER — SUGAMMADEX SODIUM 200 MG/2ML IV SOLN
INTRAVENOUS | Status: DC | PRN
  Administered 2018-03-30: 200 mg via INTRAVENOUS

## 2018-03-30 MED ORDER — CELECOXIB 200 MG PO CAPS
200.0000 mg | ORAL_CAPSULE | ORAL | Status: AC
  Administered 2018-03-30: 200 mg via ORAL
  Filled 2018-03-30: qty 1

## 2018-03-30 MED ORDER — MEPERIDINE HCL 50 MG/ML IJ SOLN: 6.2500 mg | INTRAMUSCULAR | Status: DC | PRN

## 2018-03-30 MED ORDER — OXYCODONE HCL 5 MG PO TABS: 5.0000 mg | ORAL_TABLET | ORAL | Status: DC | PRN

## 2018-03-30 MED ORDER — FENTANYL CITRATE (PF) 250 MCG/5ML IJ SOLN
INTRAMUSCULAR | Status: AC
Start: 2018-03-30 — End: ?
  Filled 2018-03-30: qty 5

## 2018-03-30 MED ORDER — OXYCODONE HCL 5 MG/5ML PO SOLN: 5.0000 mg | Freq: Once | ORAL | Status: AC | PRN

## 2018-03-30 MED ORDER — OXYCODONE HCL 5 MG PO TABS
ORAL_TABLET | ORAL | Status: AC
  Filled 2018-03-30: qty 1

## 2018-03-30 MED ORDER — ROCURONIUM BROMIDE 10 MG/ML (PF) SYRINGE
PREFILLED_SYRINGE | INTRAVENOUS | Status: DC | PRN
  Administered 2018-03-30: 10 mg via INTRAVENOUS
  Administered 2018-03-30: 50 mg via INTRAVENOUS

## 2018-03-30 MED ORDER — LACTATED RINGERS IV SOLN
INTRAVENOUS | Status: DC | PRN
  Administered 2018-03-30: 07:00:00 via INTRAVENOUS

## 2018-03-30 MED ORDER — OXYCODONE-ACETAMINOPHEN 5-325 MG PO TABS: 1.0000 | ORAL_TABLET | Freq: Four times a day (QID) | ORAL | 0 refills | Status: DC | PRN

## 2018-03-30 MED ORDER — BUPIVACAINE-EPINEPHRINE (PF) 0.25% -1:200000 IJ SOLN
INTRAMUSCULAR | Status: AC
  Filled 2018-03-30: qty 30

## 2018-03-30 MED ORDER — ACETAMINOPHEN 160 MG/5ML PO SOLN: 325.0000 mg | ORAL | Status: DC | PRN

## 2018-03-30 MED ORDER — ONDANSETRON HCL 4 MG/2ML IJ SOLN: 4.0000 mg | Freq: Once | INTRAMUSCULAR | Status: DC | PRN

## 2018-03-30 MED ORDER — FENTANYL CITRATE (PF) 100 MCG/2ML IJ SOLN: 25.0000 ug | INTRAMUSCULAR | Status: DC | PRN

## 2018-03-30 MED ORDER — DEXAMETHASONE SODIUM PHOSPHATE 10 MG/ML IJ SOLN
INTRAMUSCULAR | Status: DC | PRN
  Administered 2018-03-30: 10 mg via INTRAVENOUS

## 2018-03-30 MED ORDER — LIDOCAINE 2% (20 MG/ML) 5 ML SYRINGE
INTRAMUSCULAR | Status: DC | PRN
  Administered 2018-03-30: 80 mg via INTRAVENOUS

## 2018-03-30 MED ORDER — BUPIVACAINE-EPINEPHRINE 0.25% -1:200000 IJ SOLN
INTRAMUSCULAR | Status: DC | PRN
  Administered 2018-03-30: 10 mL

## 2018-03-30 MED ORDER — MIDAZOLAM HCL 5 MG/5ML IJ SOLN
INTRAMUSCULAR | Status: DC | PRN
  Administered 2018-03-30: 2 mg via INTRAVENOUS

## 2018-03-30 MED ORDER — FENTANYL CITRATE (PF) 100 MCG/2ML IJ SOLN
25.0000 ug | INTRAMUSCULAR | Status: DC | PRN
  Administered 2018-03-30: 25 ug via INTRAVENOUS

## 2018-03-30 MED ORDER — DOCUSATE SODIUM 100 MG PO CAPS: 100.0000 mg | ORAL_CAPSULE | Freq: Two times a day (BID) | ORAL | 0 refills | Status: AC

## 2018-03-30 MED ORDER — OXYCODONE HCL 5 MG PO TABS
5.0000 mg | ORAL_TABLET | Freq: Once | ORAL | Status: AC | PRN
  Administered 2018-03-30: 5 mg via ORAL

## 2018-03-30 SURGICAL SUPPLY — 45 items
ADH SKN CLS APL DERMABOND .7 (GAUZE/BANDAGES/DRESSINGS) ×1
APPLIER CLIP 5 13 M/L LIGAMAX5 (MISCELLANEOUS) ×3
APR CLP MED LRG 5 ANG JAW (MISCELLANEOUS) ×1
BAG SPEC RTRVL LRG 6X4 10 (ENDOMECHANICALS) ×1
BLADE CLIPPER SURG (BLADE) IMPLANT
CANISTER SUCT 3000ML PPV (MISCELLANEOUS) ×3 IMPLANT
CHLORAPREP W/TINT 26ML (MISCELLANEOUS) ×3 IMPLANT
CLIP APPLIE 5 13 M/L LIGAMAX5 (MISCELLANEOUS) ×1 IMPLANT
COVER SURGICAL LIGHT HANDLE (MISCELLANEOUS) ×3 IMPLANT
DERMABOND ADVANCED (GAUZE/BANDAGES/DRESSINGS) ×2
DERMABOND ADVANCED .7 DNX12 (GAUZE/BANDAGES/DRESSINGS) ×1 IMPLANT
ELECT REM PT RETURN 9FT ADLT (ELECTROSURGICAL) ×3
ELECTRODE REM PT RTRN 9FT ADLT (ELECTROSURGICAL) ×1 IMPLANT
GLOVE BIO SURGEON STRL SZ 6 (GLOVE) ×3 IMPLANT
GLOVE BIO SURGEON STRL SZ7.5 (GLOVE) ×2 IMPLANT
GLOVE BIOGEL PI IND STRL 6.5 (GLOVE) ×1 IMPLANT
GLOVE BIOGEL PI IND STRL 7.0 (GLOVE) IMPLANT
GLOVE BIOGEL PI IND STRL 7.5 (GLOVE) IMPLANT
GLOVE BIOGEL PI IND STRL 8 (GLOVE) IMPLANT
GLOVE BIOGEL PI INDICATOR 6.5 (GLOVE) ×2
GLOVE BIOGEL PI INDICATOR 7.0 (GLOVE) ×2
GLOVE BIOGEL PI INDICATOR 7.5 (GLOVE) ×2
GLOVE BIOGEL PI INDICATOR 8 (GLOVE) ×2
GLOVE SURG SS PI 8.0 STRL IVOR (GLOVE) ×2 IMPLANT
GOWN STRL REUS W/ TWL LRG LVL3 (GOWN DISPOSABLE) ×3 IMPLANT
GOWN STRL REUS W/TWL LRG LVL3 (GOWN DISPOSABLE) ×9
GRASPER SUT TROCAR 14GX15 (MISCELLANEOUS) ×3 IMPLANT
KIT BASIN OR (CUSTOM PROCEDURE TRAY) ×3 IMPLANT
KIT TURNOVER KIT B (KITS) ×3 IMPLANT
NDL INSUFFLATION 14GA 120MM (NEEDLE) ×1 IMPLANT
NEEDLE INSUFFLATION 14GA 120MM (NEEDLE) ×3 IMPLANT
NS IRRIG 1000ML POUR BTL (IV SOLUTION) ×3 IMPLANT
PAD ARMBOARD 7.5X6 YLW CONV (MISCELLANEOUS) ×3 IMPLANT
POUCH SPECIMEN RETRIEVAL 10MM (ENDOMECHANICALS) ×3 IMPLANT
SCISSORS LAP 5X35 DISP (ENDOMECHANICALS) ×3 IMPLANT
SET IRRIG TUBING LAPAROSCOPIC (IRRIGATION / IRRIGATOR) ×3 IMPLANT
SLEEVE ENDOPATH XCEL 5M (ENDOMECHANICALS) ×6 IMPLANT
SPECIMEN JAR SMALL (MISCELLANEOUS) ×3 IMPLANT
SUT MNCRL AB 4-0 PS2 18 (SUTURE) ×5 IMPLANT
TOWEL OR 17X24 6PK STRL BLUE (TOWEL DISPOSABLE) ×3 IMPLANT
TRAY LAPAROSCOPIC MC (CUSTOM PROCEDURE TRAY) ×3 IMPLANT
TROCAR XCEL NON-BLD 11X100MML (ENDOMECHANICALS) ×3 IMPLANT
TROCAR XCEL NON-BLD 5MMX100MML (ENDOMECHANICALS) ×3 IMPLANT
TUBING INSUFFLATION (TUBING) ×3 IMPLANT
WATER STERILE IRR 1000ML POUR (IV SOLUTION) ×3 IMPLANT

## 2018-03-30 NOTE — Anesthesia Procedure Notes (Signed)
Procedure Name: Intubation Date/Time: 03/30/2018 7:37 AM Performed by: Imagene Riches, CRNA Pre-anesthesia Checklist: Patient identified, Emergency Drugs available, Suction available and Patient being monitored Patient Re-evaluated:Patient Re-evaluated prior to induction Oxygen Delivery Method: Circle System Utilized Preoxygenation: Pre-oxygenation with 100% oxygen Induction Type: IV induction Ventilation: Mask ventilation without difficulty and Oral airway inserted - appropriate to patient size Laryngoscope Size: Miller and 3 Grade View: Grade II Tube type: Oral Tube size: 7.0 mm Number of attempts: 1 Airway Equipment and Method: Stylet and Oral airway Placement Confirmation: ETT inserted through vocal cords under direct vision,  positive ETCO2 and breath sounds checked- equal and bilateral Secured at: 22 cm Tube secured with: Tape Dental Injury: Teeth and Oropharynx as per pre-operative assessment

## 2018-03-30 NOTE — Op Note (Signed)
Operative Note  Kathy Powell 47 y.o. female [id number]  03/30/2018  Surgeon: Clovis Riley MD  Assistant: Carlena Hurl PA-C  Procedure performed: Laparoscopic Cholecystectomy  Preop diagnosis: biliary colic Post-op diagnosis/intraop findings: same, fatty liver  Specimens: gallbladder  EBL: minimal  Complications: none  Description of procedure: After obtaining informed consent the patient was brought to the operating room. Prophylactic antibiotics were administered. SCD's were applied. General endotracheal anesthesia was initiated and a formal time-out was performed. The abdomen was prepped and draped in the usual sterile fashion and the abdomen was entered using visiport technique in the left upper quadrant after instilling the site with local. Insufflation to 45mmHg was obtained and gross inspection revealed no evidence of injury from our entry or other intraabdominal abnormalities. Three 39mm trocars were introduced in the supraumbilical,  right midclavicular and right anterior axillary lines under direct visualization and following infiltration with local. The entry port was upsized to 13mm trocar. The gallbladder was retracted cephalad and the infundibulum was retracted laterally. A combination of hook electrocautery and blunt dissection was utilized to clear the peritoneum from the neck and cystic duct, circumferentially isolating the cystic artery and cystic duct and lifting the gallbladder from the cystic plate. The critical view of safety was achieved with the cystic artery, cystic duct, and liver bed visualized between them with no other structures. The artery was clipped with a single clip proximally and distally and divided as was the cystic duct with three clips on the proximal end. The gallbladder was dissected from the liver plate using electrocautery. Once freed the gallbladder was placed in an endocatch bag and removed through the epigastric trocar site. Some bile had been  spilled from the gallbladder during its dissection from the liver bed. This was aspirated and the right upper quadrant was irrigated copiously until the effluent was clear. Hemostasis was once again confirmed, and reinspection of the abdomen revealed no injuries. The clips were well opposed without any bile leak from the duct or the liver bed. The 72mm trocar site in the left upper quadrant was closed with two 0 vicryls in the fascia under direct visualization using a PMI device. The abdomen was desufflated and all trocars removed. The skin incisions were closed with running subcuticular monocryl and Dermabond. The patient was awakened, extubated and transported to the recovery room in stable condition.   All counts were correct at the completion of the case.

## 2018-03-30 NOTE — Interval H&P Note (Signed)
History and Physical Interval Note:  03/30/2018 6:12 AM  Kathy Powell  has presented today for surgery, with the diagnosis of biliary colic  The various methods of treatment have been discussed with the patient and family. After consideration of risks, benefits and other options for treatment, the patient has consented to  Procedure(s): LAPAROSCOPIC CHOLECYSTECTOMY (N/A) as a surgical intervention .  The patient's history has been reviewed, patient examined, no change in status, stable for surgery.  I have reviewed the patient's chart and labs.  Questions were answered to the patient's satisfaction.     Hammad Finkler Rich Brave

## 2018-03-30 NOTE — Anesthesia Postprocedure Evaluation (Signed)
Anesthesia Post Note  Patient: Kathy Powell  Procedure(s) Performed: LAPAROSCOPIC CHOLECYSTECTOMY (N/A Abdomen)     Patient location during evaluation: PACU Anesthesia Type: General Level of consciousness: awake and alert Pain management: pain level controlled Vital Signs Assessment: post-procedure vital signs reviewed and stable Respiratory status: spontaneous breathing, nonlabored ventilation, respiratory function stable and patient connected to nasal cannula oxygen Cardiovascular status: blood pressure returned to baseline and stable Postop Assessment: no apparent nausea or vomiting Anesthetic complications: no    Last Vitals:  Vitals:   03/30/18 0930 03/30/18 0933  BP:  119/80  Pulse: 66 66  Resp: 15 17  Temp:    SpO2: 97% 95%    Last Pain:  Vitals:   03/30/18 0942  TempSrc:   PainSc: 7                  Curlee Bogan

## 2018-03-30 NOTE — Discharge Instructions (Signed)
[facility] ______[facility] °LAPAROSCOPIC SURGERY: POST OP INSTRUCTIONS °Always review your discharge instruction sheet given to you by the facility where your surgery was performed. °IF YOU HAVE DISABILITY OR FAMILY LEAVE FORMS, YOU MUST BRING THEM TO THE OFFICE FOR PROCESSING.   °DO NOT GIVE THEM TO YOUR DOCTOR. ° °1. A prescription for pain medication may be given to you upon discharge.  Take your pain medication as prescribed, if needed.  If narcotic pain medicine is not needed, then you may take acetaminophen (Tylenol) or ibuprofen (Advil) as needed. °2. Take your usually prescribed medications unless otherwise directed. °3. If you need a refill on your pain medication, please contact your pharmacy.  They will contact our office to request authorization. Prescriptions will not be filled after 5pm or on week-ends. °4. You should follow a light diet the first few days after arrival home, such as soup and crackers, etc.  Be sure to include lots of fluids daily. °5. Most patients will experience some swelling and bruising in the area of the incisions.  Ice packs will help.  Swelling and bruising can take several days to resolve.  °6. It is common to experience some constipation if taking pain medication after surgery.  Increasing fluid intake and taking a stool softener (such as Colace) will usually help or prevent this problem from occurring.  A mild laxative (Milk of Magnesia or Miralax) should be taken according to package instructions if there are no bowel movements after 48 hours. °7. Unless discharge instructions indicate otherwise, you may remove your bandages 24-48 hours after surgery, and you may shower at that time.  You may have steri-strips (small skin tapes) in place directly over the incision.  These strips should be left on the skin for 7-10 days.  If your surgeon used skin glue on the incision, you may shower in 24 hours.  The glue will flake off over the next 2-3 weeks.  Any sutures or  staples will be removed at the office during your follow-up visit. °8. ACTIVITIES:  You may resume regular (light) daily activities beginning the next day--such as daily self-care, walking, climbing stairs--gradually increasing activities as tolerated.  You may have sexual intercourse when it is comfortable.  Refrain from any heavy lifting or straining until approved by your doctor. °a. You may drive when you are no longer taking prescription pain medication, you can comfortably wear a seatbelt, and you can safely maneuver your car and apply brakes. °b. RETURN TO WORK:  __1 week________________________________________________________ °9. You should see your doctor in the office for a follow-up appointment approximately 2-3 weeks after your surgery.  Make sure that you call for this appointment within a day or two after you arrive home to insure a convenient appointment time. °10. OTHER INSTRUCTIONS: __________________________________________________________________________________________________________________________ __________________________________________________________________________________________________________________________ °WHEN TO CALL YOUR DOCTOR: °1. Fever over 101.0 °2. Inability to urinate °3. Continued bleeding from incision. °4. Increased pain, redness, or drainage from the incision. °5. Increasing abdominal pain ° °The clinic staff is available to answer your questions during regular business hours.  Please don’t hesitate to call and ask to speak to one of the nurses for clinical concerns.  If you have a medical emergency, go to the nearest emergency room or call 911.  A surgeon from [facility] is always on call at the hospital. °[address], [address], [place], [place]  [address] ? [address] [address], [place], [place]   [address] °[phone] ? [phone] ? FAX [phone] °Web   site: [email or web address] ° °

## 2018-03-30 NOTE — Transfer of Care (Signed)
Immediate Anesthesia Transfer of Care Note  Patient: Kathy Powell  Procedure(s) Performed: LAPAROSCOPIC CHOLECYSTECTOMY (N/A Abdomen)  Patient Location: PACU  Anesthesia Type:General  Level of Consciousness: drowsy  Airway & Oxygen Therapy: Patient Spontanous Breathing and Patient connected to nasal cannula oxygen  Post-op Assessment: Report given to RN and Post -op Vital signs reviewed and stable  Post vital signs: Reviewed and stable  Last Vitals:  Vitals Value Taken Time  BP 135/85 03/30/2018  8:47 AM  Temp    Pulse 71 03/30/2018  8:47 AM  Resp 15 03/30/2018  8:47 AM  SpO2 91 % 03/30/2018  8:47 AM  Vitals shown include unvalidated device data.  Last Pain:  Vitals:   03/30/18 0639  TempSrc:   PainSc: 0-No pain         Complications: No apparent anesthesia complications

## 2018-03-31 ENCOUNTER — Encounter (HOSPITAL_COMMUNITY): Payer: Self-pay | Admitting: Surgery

## 2018-07-27 ENCOUNTER — Ambulatory Visit (INDEPENDENT_AMBULATORY_CARE_PROVIDER_SITE_OTHER): Payer: Self-pay

## 2018-07-27 ENCOUNTER — Ambulatory Visit (INDEPENDENT_AMBULATORY_CARE_PROVIDER_SITE_OTHER): Payer: 59 | Admitting: Orthopaedic Surgery

## 2018-07-27 ENCOUNTER — Encounter (INDEPENDENT_AMBULATORY_CARE_PROVIDER_SITE_OTHER): Payer: Self-pay | Admitting: Orthopaedic Surgery

## 2018-07-27 DIAGNOSIS — M25561 Pain in right knee: Secondary | ICD-10-CM

## 2018-07-27 DIAGNOSIS — G8929 Other chronic pain: Secondary | ICD-10-CM

## 2018-07-27 MED ORDER — METHYLPREDNISOLONE ACETATE 40 MG/ML IJ SUSP
40.0000 mg | INTRAMUSCULAR | Status: AC | PRN
  Administered 2018-07-27: 40 mg via INTRA_ARTICULAR

## 2018-07-27 MED ORDER — TRAMADOL HCL 50 MG PO TABS: 50.0000 mg | ORAL_TABLET | Freq: Four times a day (QID) | ORAL | 0 refills | Status: DC | PRN

## 2018-07-27 MED ORDER — LIDOCAINE HCL 1 % IJ SOLN
3.0000 mL | INTRAMUSCULAR | Status: AC | PRN
  Administered 2018-07-27: 3 mL

## 2018-07-27 NOTE — Progress Notes (Signed)
Office Visit Note   Patient: Kathy Powell           Date of Birth: 1971/06/15           MRN: [id number] Visit Date: 07/27/2018              Requested by: Aretta Nip, [place], [place] [address] PCP: Aretta Nip, MD   Assessment & Plan: Visit Diagnoses:  1. Chronic pain of right knee     Plan: Her signs and symptoms seem to be more of a distal IT band syndrome.  I did place a steroid injection around this area to see what relief it we will give her.  I would like to see her back in 2 weeks.  If she still having lateral pain I would send her for an MRI to rule out a para meniscal cyst or meniscal tear.  All question concerns were answered and addressed.  We will see how she is doing 2 weeks from now from the injection.  Follow-Up Instructions: Return in about 2 weeks (around 08/10/2018).   Orders:  Orders Placed This Encounter  Procedures  . Large Joint Inj  . XR Knee 1-2 Views Right   No orders of the defined types were placed in this encounter.     Procedures: Large Joint Inj: R knee on 07/27/2018 4:40 PM Indications: diagnostic evaluation and pain Details: 22 G 1.5 in needle, superolateral approach  Arthrogram: No  Medications: 3 mL lidocaine 1 %; 40 mg methylPREDNISolone acetate 40 MG/ML Outcome: tolerated well, no immediate complications Procedure, treatment alternatives, risks and benefits explained, specific risks discussed. Consent was given by the patient. Immediately prior to procedure a time out was called to verify the correct patient, procedure, equipment, support staff and site/side marked as required. Patient was prepped and draped in the usual sterile fashion.       Clinical Data: No additional findings.   Subjective: Chief Complaint  Patient presents with  . Right Knee - Pain  Patient is a very pleasant 47 year old with several year history of chronic right knee pain.  Is flared up again for the last 3  months with no known injury.  This the lateral aspect of her right knee that hurts with no known injury.  Dr. Radene Ou sent her this way for further relation treatment of the pain.  She says at times she cannot sleep at night due to the pain.  She did take a steroid Dosepak which helped a lot but a few days after coming off the pack the knee came back again.  Again she is never injured this knee.  She denies any locking catching but does get a lot of pain at the end of the day and it does wake her up at night.  HPI  Review of Systems She currently denies any headache, chest pain, shortness of breath, fever, chills, nausea, vomiting.  Objective: Vital Signs: There were no vitals taken for this visit.  Physical Exam She is alert and oriented x3 and in no acute distress Ortho Exam Examination of her right knee shows no knee joint effusion.  She has significant pain over the lateral joint line in the mainly the lateral IT band and around the fibular head.  The knee feels loosely stable and has full range of motion. Specialty Comments:  No specialty comments available.  Imaging: Xr Knee 1-2 Views Right  Result Date: 07/27/2018 2 views of the right knee show  no acute findings with good alignment overall.  There is only slight lateral joint space narrowing at the most lateral aspect of the joint line itself.    PMFS History: There are no active problems to display for this patient.  Past Medical History:  Diagnosis Date  . Abnormal Pap smear    10 years ago per pt   . Biliary colic   . Hypertension   . Migraine   . Yeast infection of the vagina     Family History  Problem Relation Age of Onset  . Cancer Paternal Grandmother        lung  . Cancer Maternal Grandmother        lung  . Stroke Maternal Grandmother   . Hypertension Mother   . Asthma Son   . Thyroid disease Maternal Aunt   . Breast cancer Maternal Aunt   . Hypertension Maternal Aunt   . Breast cancer Maternal Aunt      Past Surgical History:  Procedure Laterality Date  . CERCLAGE REMOVAL     insertion and removal   . CHOLECYSTECTOMY N/A 03/30/2018   Procedure: LAPAROSCOPIC CHOLECYSTECTOMY;  Surgeon: Clovis Riley, MD;  Location: [facility];  Service: General;  Laterality: N/A;  . MULTIPLE TOOTH EXTRACTIONS    . TUBAL LIGATION    . WISDOM TOOTH EXTRACTION     Social History   Occupational History  . Not on file  Tobacco Use  . Smoking status: Former Smoker    Last attempt to quit: 12/07/2017    Years since quitting: 0.6  . Smokeless tobacco: Never Used  Substance and Sexual Activity  . Alcohol use: Yes    Comment: occ  . Drug use: No  . Sexual activity: Not on file    Comment: BTL

## 2018-08-15 ENCOUNTER — Encounter (INDEPENDENT_AMBULATORY_CARE_PROVIDER_SITE_OTHER): Payer: Self-pay | Admitting: Orthopaedic Surgery

## 2018-08-15 ENCOUNTER — Ambulatory Visit (INDEPENDENT_AMBULATORY_CARE_PROVIDER_SITE_OTHER): Payer: 59 | Admitting: Orthopaedic Surgery

## 2018-08-15 DIAGNOSIS — M25561 Pain in right knee: Secondary | ICD-10-CM | POA: Diagnosis not present

## 2018-08-15 DIAGNOSIS — G8929 Other chronic pain: Secondary | ICD-10-CM

## 2018-08-15 NOTE — Progress Notes (Signed)
The patient is here for continued follow-up of right knee pain.  We have tried activity modification and rest.  Arthritis injection around the knee as well and has not helped.  It is a throbbing pain that is waking her up at night.  She is also taking anti-inflammatories and tramadol.  On exam she still seems to have a lateral joint line tenderness and tenderness along the IT band of the right knee.  It is painful throughout its arc of motion as well.  There is a positive Murray sign to the lateral aspect of the knee.  Again x-rays do not show any significant findings.  This is a needed I followed her for prolonged period of time and she is just not getting better with conservative treatment including activity modification, injections, anti-inflammatories, quad strengthening exercises and rest as well as time.  At this point an MRI is warranted to assess the structures around the knee for internal derangement.  All question concerns were answered and addressed.  She is she has an MRI scheduled she will call us for follow-up appointment.

## 2018-08-15 NOTE — Addendum Note (Signed)
Addended by: Precious Bard on: 08/15/2018 04:42 PM   Modules accepted: Orders

## 2018-08-29 ENCOUNTER — Ambulatory Visit
Admission: RE | Admit: 2018-08-29 | Discharge: 2018-08-29 | Disposition: A | Discharge status: Home / Self Care | Payer: 59 | Source: Ambulatory Visit | Attending: Orthopaedic Surgery | Admitting: Orthopaedic Surgery

## 2018-08-29 DIAGNOSIS — M25561 Pain in right knee: Principal | ICD-10-CM

## 2018-08-29 DIAGNOSIS — G8929 Other chronic pain: Secondary | ICD-10-CM

## 2018-08-31 ENCOUNTER — Encounter (INDEPENDENT_AMBULATORY_CARE_PROVIDER_SITE_OTHER): Payer: Self-pay | Admitting: Physician Assistant

## 2018-08-31 ENCOUNTER — Ambulatory Visit (INDEPENDENT_AMBULATORY_CARE_PROVIDER_SITE_OTHER): Payer: 59 | Admitting: Physician Assistant

## 2018-08-31 DIAGNOSIS — S83241D Other tear of medial meniscus, current injury, right knee, subsequent encounter: Secondary | ICD-10-CM

## 2018-08-31 DIAGNOSIS — S83281D Other tear of lateral meniscus, current injury, right knee, subsequent encounter: Secondary | ICD-10-CM

## 2018-08-31 NOTE — Progress Notes (Signed)
Office Visit Note   Patient: Kathy Powell           Date of Birth: [birth year]           MRN: [id number] Visit Date: 08/31/2018              Requested by: Kathy Powell, [place], [place] [address] PCP: Kathy Nip, MD   Assessment & Plan: Visit Diagnoses:  1. Acute lateral meniscus tear of right knee, subsequent encounter   2. Other tear of medial meniscus of right knee, unspecified whether old or current tear, subsequent encounter     Plan: Due to the fact the patient has failed conservative measures recommend knee arthroscopy for debridement and partial medial lateral meniscectomies.  Discussed with patient risks which include but are not limited to DVT/PE, prolonged pain, worsening pain, nerve/ vessel injury and infection.  Also discussed with her that this will not address the knee arthritic changes she has.  She would like to proceed with a right knee arthroscopy with partial meniscectomies as discussed above.  Follow-Up Instructions: Return for 1 week post-op.   Orders:  No orders of the defined types were placed in this encounter.  No orders of the defined types were placed in this encounter.     Procedures: No procedures performed   Clinical Data: No additional findings.   Subjective: No chief complaint on file.   HPI Risks [name] 47 year old female returns today to go over MRI of her right knee.  She continues to have right knee pain.  She states she tried injections which really have not helped.  Most her pain remains the lateral aspect of her knee.  Again her pain in her knee really began about 3 months ago without injury.  She is tried a Medrol Dosepak cortisone injection.  She notes no injury to the knee.  She denies any catching locking of the knee. MRI results are reviewed with the patient and show a small vertical tear the posterior periphery of the medial meniscus.  Lateral meniscus with an oblique tear of the body  of the lateral meniscus extending to the inferior articular surface.  Medial compartment with mild chondromalacia.  Lateral compartment with partial thickness cartilage loss lateral tibial plateau with areas of full-thickness cartilage loss along the posterior weightbearing surface.  High-grade partial cartilage loss of the medial femoral condyle is also noted. Review of Systems Please see HPI otherwise negative  Objective: Vital Signs: There were no vitals taken for this visit.  Physical Exam  Constitutional: She is oriented to person, place, and time. She appears well-developed and well-nourished. No distress.  Neurological: She is alert and oriented to person, place, and time.  Skin: She is not diaphoretic.    Ortho Exam Right knee she has tenderness over the lateral joint line.  Good range of motion of the knee but painful range of motion. Specialty Comments:  No specialty comments available.  Imaging: No results found.   PMFS History: Patient Active Problem List   Diagnosis Date Noted  . Chronic pain of right knee 08/15/2018   Past Medical History:  Diagnosis Date  . Abnormal Pap smear    10 years ago per pt   . Biliary colic   . Hypertension   . Migraine   . Yeast infection of the vagina     Family History  Problem Relation Age of Onset  . Cancer Paternal Grandmother  lung  . Cancer Maternal Grandmother        lung  . Stroke Maternal Grandmother   . Hypertension Mother   . Asthma Son   . Thyroid disease Maternal Aunt   . Breast cancer Maternal Aunt   . Hypertension Maternal Aunt   . Breast cancer Maternal Aunt     Past Surgical History:  Procedure Laterality Date  . CERCLAGE REMOVAL     insertion and removal   . CHOLECYSTECTOMY N/A 03/30/2018   Procedure: LAPAROSCOPIC CHOLECYSTECTOMY;  Surgeon: Clovis Riley, MD;  Location: [facility];  Service: General;  Laterality: N/A;  . MULTIPLE TOOTH EXTRACTIONS    . TUBAL LIGATION    . WISDOM TOOTH EXTRACTION      Social History   Occupational History  . Not on file  Tobacco Use  . Smoking status: Former Smoker    Last attempt to quit: 12/07/2017    Years since quitting: 0.7  . Smokeless tobacco: Never Used  Substance and Sexual Activity  . Alcohol use: Yes    Comment: occ  . Drug use: No  . Sexual activity: Not on file    Comment: BTL

## 2018-09-08 DIAGNOSIS — S83271A Complex tear of lateral meniscus, current injury, right knee, initial encounter: Secondary | ICD-10-CM | POA: Diagnosis not present

## 2018-09-08 DIAGNOSIS — S83231A Complex tear of medial meniscus, current injury, right knee, initial encounter: Secondary | ICD-10-CM | POA: Diagnosis not present

## 2018-09-15 ENCOUNTER — Encounter (INDEPENDENT_AMBULATORY_CARE_PROVIDER_SITE_OTHER): Payer: Self-pay | Admitting: Orthopaedic Surgery

## 2018-09-15 ENCOUNTER — Ambulatory Visit (INDEPENDENT_AMBULATORY_CARE_PROVIDER_SITE_OTHER): Payer: 59 | Admitting: Orthopaedic Surgery

## 2018-09-15 DIAGNOSIS — Z9889 Other specified postprocedural states: Secondary | ICD-10-CM

## 2018-09-15 NOTE — Progress Notes (Signed)
Patient is one-week status post a right knee arthroscopy.  She had a partial lateral meniscectomy.  There is significant maceration of her lateral meniscus due to valgus malalignment of the knee.  The cartilage itself looks pretty good.  There is about grade 2 cartilage changes on the lateral tibial plateau.  The rest of her knee looked pristine.  She is doing well overall.  She denies any calf pain medicine tingling in the foot or swelling in her foot.  Is been taking some hydrocodone on occasion but overall doing fine.  On exam her knee does have moderate effusion.  She has good range of motion of her right knee.  Suture sites look good to remove the sutures.  Her calf is soft her foot is not swollen.  We went over arthroscopy pictures and I talked about her knee and things that I would like to do to strengthen her quad muscles.  We will see her back in 4 weeks to see how she is doing overall.  All questions concerns were answered and addressed.

## 2018-10-03 ENCOUNTER — Other Ambulatory Visit (INDEPENDENT_AMBULATORY_CARE_PROVIDER_SITE_OTHER): Payer: Self-pay | Admitting: Orthopaedic Surgery

## 2018-10-03 ENCOUNTER — Telehealth (INDEPENDENT_AMBULATORY_CARE_PROVIDER_SITE_OTHER): Payer: Self-pay

## 2018-10-03 MED ORDER — HYDROCODONE-ACETAMINOPHEN 5-325 MG PO TABS: 1.0000 | ORAL_TABLET | Freq: Four times a day (QID) | ORAL | 0 refills | Status: DC | PRN

## 2018-10-03 NOTE — Telephone Encounter (Signed)
Patient aware.

## 2018-10-03 NOTE — Telephone Encounter (Signed)
She should still have some pain in her knee after the arthroscopic surgery were performed given HER KNEE.  THE ONLY THING WE CAN DO AT THIS POINT IS SEND HER TO PHYSICAL THERAPY TO HELP WITH STRENGTHENING HER KNEE AND DECREASING HER PAIN.  WE CAN SEE HER BACK IN THE OFFICE FOR A STEROID INJECTION TO HELP WITH POSTOPERATIVE PAIN.  I WILL SEND IN AT LEAST ONE MORE PRESCRIPTION FOR HYDROCODONE BUT WE WILL NOT KEEP HER ON THIS MEDICATION LONG-TERM.

## 2018-10-03 NOTE — Telephone Encounter (Signed)
Please advise 

## 2018-10-03 NOTE — Telephone Encounter (Signed)
Patient called and states she is having pain and swelling. Finished Norco took the last one today. Would like a refill. Elevates leg most of the time. Nothing seems to help. Alternates heat and ice. Ice makes it hurt worse. Status post a right knee arthroscopy. Would like a CB  [phone]

## 2018-10-06 ENCOUNTER — Ambulatory Visit (INDEPENDENT_AMBULATORY_CARE_PROVIDER_SITE_OTHER): Payer: 59 | Admitting: Physician Assistant

## 2018-10-13 ENCOUNTER — Encounter (INDEPENDENT_AMBULATORY_CARE_PROVIDER_SITE_OTHER): Payer: Self-pay | Admitting: Physician Assistant

## 2018-10-13 ENCOUNTER — Ambulatory Visit (INDEPENDENT_AMBULATORY_CARE_PROVIDER_SITE_OTHER): Payer: 59 | Admitting: Physician Assistant

## 2018-10-13 DIAGNOSIS — Z9889 Other specified postprocedural states: Secondary | ICD-10-CM

## 2018-10-13 DIAGNOSIS — M25561 Pain in right knee: Secondary | ICD-10-CM | POA: Diagnosis not present

## 2018-10-13 MED ORDER — LIDOCAINE HCL 1 % IJ SOLN
3.0000 mL | INTRAMUSCULAR | Status: AC | PRN
  Administered 2018-10-13: 3 mL

## 2018-10-13 MED ORDER — METHYLPREDNISOLONE ACETATE 40 MG/ML IJ SUSP
40.0000 mg | INTRAMUSCULAR | Status: AC | PRN
  Administered 2018-10-13: 40 mg via INTRA_ARTICULAR

## 2018-10-13 NOTE — Progress Notes (Signed)
.     HPI: [name] returns today status post right knee arthroscopy now 6 weeks postop.  She underwent a partial lateral meniscectomy she had grade 2 cartilage changes on the lateral tibial plateau.  She continues to have pain in the knee.  She is requesting a cortisone injection in the knee today. Procedure Note  Patient: Kathy Powell             Date of Birth: 04-Aug-1971           MRN: [id number]             Visit Date: 10/13/2018 Physical exam right knee: Port sites well-healed calf supple nontender.  Tenderness along medial joint line.  No instability.  Overall good range of motion.  Procedures: Visit Diagnoses: No diagnosis found.  Large Joint Inj: L knee on 10/13/2018 5:54 PM Indications: pain Details: 22 G 1.5 in needle, anterolateral approach  Arthrogram: No  Medications: 3 mL lidocaine 1 %; 40 mg methylPREDNISolone acetate 40 MG/ML Outcome: tolerated well, no immediate complications Procedure, treatment alternatives, risks and benefits explained, specific risks discussed. Consent was given by the patient. Immediately prior to procedure a time out was called to verify the correct patient, procedure, equipment, support staff and site/side marked as required. Patient was prepped and draped in the usual sterile fashion.    Plan: We will see her back in 2 weeks to see her response to the injection.  She will continue to work on range of motion strengthening the knee.

## 2018-10-17 ENCOUNTER — Ambulatory Visit (INDEPENDENT_AMBULATORY_CARE_PROVIDER_SITE_OTHER): Payer: 59 | Admitting: Orthopaedic Surgery

## 2018-10-27 ENCOUNTER — Encounter (INDEPENDENT_AMBULATORY_CARE_PROVIDER_SITE_OTHER): Payer: Self-pay | Admitting: Physician Assistant

## 2018-10-27 ENCOUNTER — Ambulatory Visit (INDEPENDENT_AMBULATORY_CARE_PROVIDER_SITE_OTHER): Payer: 59 | Admitting: Physician Assistant

## 2018-10-27 DIAGNOSIS — Z9889 Other specified postprocedural states: Secondary | ICD-10-CM

## 2018-10-27 NOTE — Progress Notes (Signed)
Office Visit Note   Patient: Kathy Powell           Date of Birth: [birth year]           MRN: [id number] Visit Date: 10/27/2018              Requested by: Aretta Nip, [place], [place] [address] PCP: Aretta Nip, MD   Assessment & Plan: Visit Diagnoses:  1. Status post arthroscopy of right knee     Plan: She will continue to work on quad strengthening.  She will follow-up on an as-needed basis she understands that she could have cortisone injections no more often than every 6 months.  May consider supplemental injection in the future her pain persist.  Follow-Up Instructions: Return if symptoms worsen or fail to improve.   Orders:  No orders of the defined types were placed in this encounter.  No orders of the defined types were placed in this encounter.     Procedures: No procedures performed   Clinical Data: No additional findings.   Subjective: Chief Complaint  Patient presents with  . Right Knee - Follow-up, Routine Post Op    HPI Mrs. Gullickson returns today now 10 weeks status post right knee arthroscopy.  She is overall doing well.  She states cortisone injection definitely helped with the pain she was having.  She states now she has a lot more good days in regards to her knees.  She is doing much better than she was doing prior to surgery.  She notes increased range of motion of her knee and the fact that she is able to walk 3 miles a day.  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 Review of Systems See HPI otherwise negative  Objective: Vital Signs: There were no vitals taken for this visit.  Physical Exam  Ortho Exam Right knee good range of motion.  No effusion abnormal warmth erythema.  Port sites well-healed.  No instability valgus varus stressing.  She has some slight tenderness along medial joint line no tenderness along lateral joint line. Specialty Comments:  No specialty  comments available.  Imaging: No results found.   PMFS History: Patient Active Problem List   Diagnosis Date Noted  . Status post arthroscopy of right knee 09/15/2018  . Chronic pain of right knee 08/15/2018   Past Medical History:  Diagnosis Date  . Abnormal Pap smear    10 years ago per pt   . Biliary colic   . Hypertension   . Migraine   . Yeast infection of the vagina     Family History  Problem Relation Age of Onset  . Cancer Paternal Grandmother        lung  . Cancer Maternal Grandmother        lung  . Stroke Maternal Grandmother   . Hypertension Mother   . Asthma Son   . Thyroid disease Maternal Aunt   . Breast cancer Maternal Aunt   . Hypertension Maternal Aunt   . Breast cancer Maternal Aunt     Past Surgical History:  Procedure Laterality Date  . CERCLAGE REMOVAL     insertion and removal   . CHOLECYSTECTOMY N/A 03/30/2018   Procedure: LAPAROSCOPIC CHOLECYSTECTOMY;  Surgeon: Clovis Riley, MD;  Location: [facility];  Service: General;  Laterality: N/A;  . MULTIPLE TOOTH EXTRACTIONS    . TUBAL LIGATION    . WISDOM TOOTH EXTRACTION  Social History   Occupational History  . Not on file  Tobacco Use  . Smoking status: Former Smoker    Last attempt to quit: 12/07/2017    Years since quitting: 0.8  . Smokeless tobacco: Never Used  Substance and Sexual Activity  . Alcohol use: Yes    Comment: occ  . Drug use: No  . Sexual activity: Not on file    Comment: BTL

## 2019-01-10 ENCOUNTER — Other Ambulatory Visit: Payer: Self-pay | Admitting: Family Medicine

## 2019-01-10 ENCOUNTER — Other Ambulatory Visit (HOSPITAL_COMMUNITY)
Admission: RE | Admit: 2019-01-10 | Discharge: 2019-01-10 | Disposition: A | Discharge status: Home / Self Care | Payer: 59 | Source: Ambulatory Visit | Attending: Family Medicine | Admitting: Family Medicine

## 2019-01-10 DIAGNOSIS — Z124 Encounter for screening for malignant neoplasm of cervix: Secondary | ICD-10-CM | POA: Insufficient documentation

## 2019-01-12 LAB — CYTOLOGY - PAP
DIAGNOSIS: NEGATIVE
HPV (WINDOPATH): NOT DETECTED

## 2019-04-27 ENCOUNTER — Other Ambulatory Visit: Payer: Self-pay | Admitting: Internal Medicine

## 2019-04-27 DIAGNOSIS — E049 Nontoxic goiter, unspecified: Secondary | ICD-10-CM

## 2019-05-12 ENCOUNTER — Other Ambulatory Visit: Payer: Self-pay

## 2019-05-15 ENCOUNTER — Ambulatory Visit (INDEPENDENT_AMBULATORY_CARE_PROVIDER_SITE_OTHER): Payer: No Typology Code available for payment source

## 2019-05-15 ENCOUNTER — Ambulatory Visit: Payer: No Typology Code available for payment source

## 2019-05-15 ENCOUNTER — Other Ambulatory Visit: Payer: Self-pay

## 2019-05-15 ENCOUNTER — Encounter: Payer: Self-pay | Admitting: Podiatry

## 2019-05-15 ENCOUNTER — Ambulatory Visit: Payer: No Typology Code available for payment source | Admitting: Podiatry

## 2019-05-15 VITALS — Temp 96.6°F

## 2019-05-15 DIAGNOSIS — M7662 Achilles tendinitis, left leg: Secondary | ICD-10-CM | POA: Diagnosis not present

## 2019-05-15 DIAGNOSIS — M25572 Pain in left ankle and joints of left foot: Secondary | ICD-10-CM

## 2019-05-15 MED ORDER — TRIAMCINOLONE ACETONIDE 10 MG/ML IJ SUSP
10.0000 mg | Freq: Once | INTRAMUSCULAR | Status: AC
  Administered 2019-05-15: 10 mg

## 2019-05-15 MED ORDER — DICLOFENAC SODIUM 75 MG PO TBEC: 75.0000 mg | DELAYED_RELEASE_TABLET | Freq: Two times a day (BID) | ORAL | 2 refills | Status: DC

## 2019-05-15 NOTE — Progress Notes (Signed)
Subjective:   Patient ID: Kathy Powell, female   DOB: 48 y.o.   MRN: [id number]   HPI Patient presents stating of getting a lot of pain in the bottom from her left heel and I do not remember specific injury but I am now walking 10 to 12,000 feet steps a day which I have not done typically.  Patient's not been seen in over 4 years and patient does not smoke and likes to be active   Review of Systems  All other systems reviewed and are negative.       Objective:  Physical Exam Vitals signs and nursing note reviewed.  Constitutional:      Appearance: She is well-developed.  Pulmonary:     Effort: Pulmonary effort is normal.  Musculoskeletal: Normal range of motion.  Skin:    General: Skin is warm.  Neurological:     Mental Status: She is alert.     Neurovascular status intact muscle strength adequate range of motion within normal limits with patient found to have quite a bit of inflammation at the insertion of the Achilles tendon left medial side with fluid buildup noted and pain with palpation.  Patient does have moderate equinus condition bilateral and was found to have good digital perfusion and well oriented x3     Assessment:  Achilles tendinitis left medial side with quite a bit of inflammation at the insertion with problems exasperated by ambulation     Plan:  H&P conditions reviewed and I did do a careful injection after first discussing risk of rupture.  I injected the medial side 3 mg Dexasone Kenalog 5 mg Xylocaine and then applied air fracture walker to completely immobilize along with ice therapy physical therapy and will be seen back 3 weeks or earlier if needed  X-rays were negative for signs of spurring or other bony pathology

## 2019-05-15 NOTE — Patient Instructions (Signed)

## 2019-05-23 ENCOUNTER — Other Ambulatory Visit: Payer: Self-pay

## 2019-05-23 ENCOUNTER — Ambulatory Visit
Admission: RE | Admit: 2019-05-23 | Discharge: 2019-05-23 | Disposition: A | Discharge status: Home / Self Care | Payer: No Typology Code available for payment source | Source: Ambulatory Visit | Attending: Internal Medicine | Admitting: Internal Medicine

## 2019-05-23 DIAGNOSIS — E049 Nontoxic goiter, unspecified: Secondary | ICD-10-CM

## 2019-06-05 ENCOUNTER — Other Ambulatory Visit: Payer: Self-pay

## 2019-06-05 ENCOUNTER — Encounter: Payer: Self-pay | Admitting: Podiatry

## 2019-06-05 ENCOUNTER — Ambulatory Visit (INDEPENDENT_AMBULATORY_CARE_PROVIDER_SITE_OTHER): Payer: No Typology Code available for payment source | Admitting: Podiatry

## 2019-06-05 VITALS — Temp 97.3°F

## 2019-06-05 DIAGNOSIS — M7662 Achilles tendinitis, left leg: Secondary | ICD-10-CM | POA: Diagnosis not present

## 2019-06-05 NOTE — Progress Notes (Signed)
Subjective:   Patient ID: Kathy Powell, female   DOB: 48 y.o.   MRN: [id number]   HPI Patient presents stating my left ankle is feeling quite a bit better and states that the boot has been very helpful   ROS      Objective:  Physical Exam  Neurovascular status intact negative Homans sign noted with patient's posterior left medial heel quite improved at its insertion into the Achilles with mild discomfort and no restriction of motion     Assessment:  Improvement of acute Achilles tendinitis left with utilization of boot     Plan:  H&P condition reviewed and at this point recommended physical therapy anti-inflammatories gradual reduction of boot usage and stretching exercises.  Patient will be seen back if symptoms were to occur again and will have to come up with different treatment options available

## 2019-06-13 ENCOUNTER — Other Ambulatory Visit: Payer: Self-pay | Admitting: Internal Medicine

## 2019-06-13 ENCOUNTER — Ambulatory Visit: Payer: Self-pay | Admitting: Ophthalmology

## 2019-06-13 DIAGNOSIS — E0590 Thyrotoxicosis, unspecified without thyrotoxic crisis or storm: Secondary | ICD-10-CM

## 2019-06-28 ENCOUNTER — Encounter (HOSPITAL_COMMUNITY)
Admission: RE | Admit: 2019-06-28 | Discharge: 2019-06-28 | Disposition: A | Discharge status: Home / Self Care | Payer: No Typology Code available for payment source | Source: Ambulatory Visit | Attending: Internal Medicine | Admitting: Internal Medicine

## 2019-06-28 ENCOUNTER — Other Ambulatory Visit: Payer: Self-pay

## 2019-06-28 DIAGNOSIS — E0590 Thyrotoxicosis, unspecified without thyrotoxic crisis or storm: Secondary | ICD-10-CM | POA: Diagnosis not present

## 2019-06-28 MED ORDER — SODIUM IODIDE I-123 7.4 MBQ CAPS
400.0000 | ORAL_CAPSULE | Freq: Once | ORAL | Status: AC
  Administered 2019-06-28: 400 via ORAL

## 2019-06-29 ENCOUNTER — Encounter (HOSPITAL_COMMUNITY)
Admission: RE | Admit: 2019-06-29 | Discharge: 2019-06-29 | Disposition: A | Discharge status: Home / Self Care | Payer: No Typology Code available for payment source | Source: Ambulatory Visit | Attending: Internal Medicine | Admitting: Internal Medicine

## 2019-06-30 ENCOUNTER — Encounter (HOSPITAL_BASED_OUTPATIENT_CLINIC_OR_DEPARTMENT_OTHER): Payer: Self-pay | Admitting: *Deleted

## 2019-06-30 ENCOUNTER — Other Ambulatory Visit: Payer: Self-pay

## 2019-06-30 NOTE — Progress Notes (Signed)
Coming Tuesday for BMET, EKG and urine pregnancy. Going for Covid test Tuesday at [facility].

## 2019-07-04 ENCOUNTER — Encounter (HOSPITAL_BASED_OUTPATIENT_CLINIC_OR_DEPARTMENT_OTHER)
Admission: RE | Admit: 2019-07-04 | Discharge: 2019-07-04 | Disposition: A | Discharge status: Home / Self Care | Payer: No Typology Code available for payment source | Source: Ambulatory Visit | Attending: Ophthalmology | Admitting: Ophthalmology

## 2019-07-04 ENCOUNTER — Other Ambulatory Visit: Payer: Self-pay

## 2019-07-04 ENCOUNTER — Other Ambulatory Visit (HOSPITAL_COMMUNITY)
Admission: RE | Admit: 2019-07-04 | Discharge: 2019-07-04 | Disposition: A | Discharge status: Home / Self Care | Payer: No Typology Code available for payment source | Source: Ambulatory Visit | Attending: Ophthalmology | Admitting: Ophthalmology

## 2019-07-04 DIAGNOSIS — Z20828 Contact with and (suspected) exposure to other viral communicable diseases: Secondary | ICD-10-CM | POA: Diagnosis not present

## 2019-07-04 DIAGNOSIS — H5010 Unspecified exotropia: Secondary | ICD-10-CM | POA: Insufficient documentation

## 2019-07-04 DIAGNOSIS — Z01818 Encounter for other preprocedural examination: Secondary | ICD-10-CM | POA: Insufficient documentation

## 2019-07-04 LAB — BASIC METABOLIC PANEL
Anion gap: 11 (ref 5–15)
BUN: 13 mg/dL (ref 6–20)
CO2: 23 mmol/L (ref 22–32)
Calcium: 9.5 mg/dL (ref 8.9–10.3)
Chloride: 104 mmol/L (ref 98–111)
Creatinine, Ser: 1.23 mg/dL — ABNORMAL HIGH (ref 0.44–1.00)
GFR calc Af Amer: >60 mL/min (ref >60)
GFR calc non Af Amer: 53 mL/min — ABNORMAL LOW (ref >60)
Glucose, Bld: 98 mg/dL (ref 70–99)
Potassium: 3.8 mmol/L (ref 3.5–5.1)
Sodium: 138 mmol/L (ref 135–145)

## 2019-07-04 LAB — SARS CORONAVIRUS 2 (TAT 6-24 HRS): SARS Coronavirus 2: NEGATIVE

## 2019-07-04 LAB — POCT PREGNANCY, URINE: Preg Test, Ur: NEGATIVE

## 2019-07-04 NOTE — Progress Notes (Signed)
EKG  reviewed by Dr. Rose, will proceed with surgery as scheduled.  

## 2019-07-07 ENCOUNTER — Ambulatory Visit: Payer: Self-pay | Admitting: Ophthalmology

## 2019-07-07 ENCOUNTER — Encounter (HOSPITAL_BASED_OUTPATIENT_CLINIC_OR_DEPARTMENT_OTHER): Payer: Self-pay | Admitting: Anesthesiology

## 2019-07-07 ENCOUNTER — Encounter (HOSPITAL_BASED_OUTPATIENT_CLINIC_OR_DEPARTMENT_OTHER)
Admission: RE | Disposition: A | Discharge status: Home / Self Care | Payer: Self-pay | Source: Home / Self Care | Attending: Ophthalmology

## 2019-07-07 ENCOUNTER — Ambulatory Visit (HOSPITAL_BASED_OUTPATIENT_CLINIC_OR_DEPARTMENT_OTHER): Payer: No Typology Code available for payment source | Admitting: Anesthesiology

## 2019-07-07 ENCOUNTER — Ambulatory Visit (HOSPITAL_BASED_OUTPATIENT_CLINIC_OR_DEPARTMENT_OTHER)
Admission: RE | Admit: 2019-07-07 | Discharge: 2019-07-07 | Disposition: A | Discharge status: Home / Self Care | Payer: No Typology Code available for payment source | Attending: Ophthalmology | Admitting: Ophthalmology

## 2019-07-07 DIAGNOSIS — E0590 Thyrotoxicosis, unspecified without thyrotoxic crisis or storm: Secondary | ICD-10-CM | POA: Insufficient documentation

## 2019-07-07 DIAGNOSIS — Z87891 Personal history of nicotine dependence: Secondary | ICD-10-CM | POA: Insufficient documentation

## 2019-07-07 DIAGNOSIS — H5010 Unspecified exotropia: Secondary | ICD-10-CM | POA: Insufficient documentation

## 2019-07-07 DIAGNOSIS — Z79899 Other long term (current) drug therapy: Secondary | ICD-10-CM | POA: Diagnosis not present

## 2019-07-07 DIAGNOSIS — Z6839 Body mass index (BMI) 39.0-39.9, adult: Secondary | ICD-10-CM | POA: Insufficient documentation

## 2019-07-07 DIAGNOSIS — I10 Essential (primary) hypertension: Secondary | ICD-10-CM | POA: Diagnosis not present

## 2019-07-07 HISTORY — PX: STRABISMUS SURGERY: SHX218

## 2019-07-07 HISTORY — DX: Thyrotoxicosis, unspecified without thyrotoxic crisis or storm: E05.90

## 2019-07-07 SURGERY — REPAIR STRABISMUS
Anesthesia: General | Site: Eye | Laterality: Bilateral

## 2019-07-07 MED ORDER — ACETAMINOPHEN 160 MG/5ML PO SOLN: 325.0000 mg | ORAL | Status: DC | PRN

## 2019-07-07 MED ORDER — MEPERIDINE HCL 25 MG/ML IJ SOLN: 6.2500 mg | INTRAMUSCULAR | Status: DC | PRN

## 2019-07-07 MED ORDER — OXYCODONE HCL 5 MG/5ML PO SOLN: 5.0000 mg | Freq: Once | ORAL | Status: AC | PRN

## 2019-07-07 MED ORDER — FENTANYL CITRATE (PF) 100 MCG/2ML IJ SOLN
INTRAMUSCULAR | Status: AC
  Filled 2019-07-07: qty 2

## 2019-07-07 MED ORDER — ONDANSETRON HCL 4 MG/2ML IJ SOLN
INTRAMUSCULAR | Status: DC | PRN
  Administered 2019-07-07: 4 mg via INTRAVENOUS

## 2019-07-07 MED ORDER — LACTATED RINGERS IV SOLN
INTRAVENOUS | Status: DC
  Administered 2019-07-07: 07:00:00 via INTRAVENOUS

## 2019-07-07 MED ORDER — LIDOCAINE 2% (20 MG/ML) 5 ML SYRINGE
INTRAMUSCULAR | Status: DC | PRN
  Administered 2019-07-07: 100 mg via INTRAVENOUS

## 2019-07-07 MED ORDER — OXYCODONE HCL 5 MG PO TABS
ORAL_TABLET | ORAL | Status: AC
  Filled 2019-07-07: qty 1

## 2019-07-07 MED ORDER — PROPOFOL 500 MG/50ML IV EMUL
INTRAVENOUS | Status: AC
  Filled 2019-07-07: qty 50

## 2019-07-07 MED ORDER — MIDAZOLAM HCL 2 MG/2ML IJ SOLN
1.0000 mg | INTRAMUSCULAR | Status: DC | PRN
  Administered 2019-07-07: 2 mg via INTRAVENOUS

## 2019-07-07 MED ORDER — OXYCODONE HCL 5 MG PO TABS
5.0000 mg | ORAL_TABLET | Freq: Once | ORAL | Status: AC | PRN
  Administered 2019-07-07: 5 mg via ORAL

## 2019-07-07 MED ORDER — KETOROLAC TROMETHAMINE 30 MG/ML IJ SOLN
INTRAMUSCULAR | Status: DC | PRN
  Administered 2019-07-07: 30 mg via INTRAVENOUS

## 2019-07-07 MED ORDER — PROPOFOL 10 MG/ML IV BOLUS
INTRAVENOUS | Status: DC | PRN
  Administered 2019-07-07: 200 mg via INTRAVENOUS

## 2019-07-07 MED ORDER — ONDANSETRON HCL 4 MG/2ML IJ SOLN: 4.0000 mg | Freq: Once | INTRAMUSCULAR | Status: DC | PRN

## 2019-07-07 MED ORDER — SCOPOLAMINE 1 MG/3DAYS TD PT72: 1.0000 | MEDICATED_PATCH | Freq: Once | TRANSDERMAL | Status: DC

## 2019-07-07 MED ORDER — DEXAMETHASONE SODIUM PHOSPHATE 4 MG/ML IJ SOLN
INTRAMUSCULAR | Status: DC | PRN
  Administered 2019-07-07: 10 mg via INTRAVENOUS

## 2019-07-07 MED ORDER — TOBRAMYCIN-DEXAMETHASONE 0.3-0.1 % OP OINT
TOPICAL_OINTMENT | OPHTHALMIC | Status: DC | PRN
  Administered 2019-07-07: 1 via OPHTHALMIC

## 2019-07-07 MED ORDER — GLYCOPYRROLATE PF 0.2 MG/ML IJ SOSY
PREFILLED_SYRINGE | INTRAMUSCULAR | Status: AC
  Filled 2019-07-07: qty 1

## 2019-07-07 MED ORDER — FENTANYL CITRATE (PF) 100 MCG/2ML IJ SOLN
25.0000 ug | INTRAMUSCULAR | Status: DC | PRN
  Administered 2019-07-07: 50 ug via INTRAVENOUS

## 2019-07-07 MED ORDER — FENTANYL CITRATE (PF) 100 MCG/2ML IJ SOLN
50.0000 ug | INTRAMUSCULAR | Status: DC | PRN
  Administered 2019-07-07 (×2): 50 ug via INTRAVENOUS

## 2019-07-07 MED ORDER — MIDAZOLAM HCL 2 MG/2ML IJ SOLN
INTRAMUSCULAR | Status: AC
  Filled 2019-07-07: qty 2

## 2019-07-07 MED ORDER — LIDOCAINE 2% (20 MG/ML) 5 ML SYRINGE
INTRAMUSCULAR | Status: AC
  Filled 2019-07-07: qty 5

## 2019-07-07 MED ORDER — ONDANSETRON HCL 4 MG/2ML IJ SOLN
INTRAMUSCULAR | Status: AC
  Filled 2019-07-07: qty 2

## 2019-07-07 MED ORDER — GLYCOPYRROLATE 0.2 MG/ML IJ SOLN
INTRAMUSCULAR | Status: DC | PRN
  Administered 2019-07-07: 0.4 mg via INTRAVENOUS
  Administered 2019-07-07: .4 mg via INTRAVENOUS

## 2019-07-07 MED ORDER — DEXAMETHASONE SODIUM PHOSPHATE 10 MG/ML IJ SOLN
INTRAMUSCULAR | Status: AC
  Filled 2019-07-07: qty 1

## 2019-07-07 MED ORDER — ACETAMINOPHEN 325 MG PO TABS: 325.0000 mg | ORAL_TABLET | ORAL | Status: DC | PRN

## 2019-07-07 SURGICAL SUPPLY — 35 items
APL SRG 3 HI ABS STRL LF PLS (MISCELLANEOUS) ×1
APL SWBSTK 6 STRL LF DISP (MISCELLANEOUS) ×4
APPLICATOR COTTON TIP 6 STRL (MISCELLANEOUS) ×4 IMPLANT
APPLICATOR COTTON TIP 6IN STRL (MISCELLANEOUS) ×12
APPLICATOR DR MATTHEWS STRL (MISCELLANEOUS) ×3 IMPLANT
BNDG EYE OVAL (GAUZE/BANDAGES/DRESSINGS) IMPLANT
CAUTERY EYE LOW TEMP 1300F FIN (OPHTHALMIC RELATED) IMPLANT
CLOSURE WOUND 1/4X4 (GAUZE/BANDAGES/DRESSINGS)
COVER BACK TABLE REUSABLE LG (DRAPES) ×3 IMPLANT
COVER MAYO STAND REUSABLE (DRAPES) ×3 IMPLANT
COVER WAND RF STERILE (DRAPES) IMPLANT
DRAPE HALF SHEET 70X43 (DRAPES) IMPLANT
DRAPE SPLIT 6X30 W/TAPE (DRAPES) ×2 IMPLANT
DRAPE SURG 17X23 STRL (DRAPES) ×6 IMPLANT
GLOVE BIO SURGEON STRL SZ 6.5 (GLOVE) ×4 IMPLANT
GLOVE BIO SURGEONS STRL SZ 6.5 (GLOVE) ×2
GLOVE BIOGEL M STRL SZ7.5 (GLOVE) ×5 IMPLANT
GOWN STRL REUS W/ TWL LRG LVL3 (GOWN DISPOSABLE) ×1 IMPLANT
GOWN STRL REUS W/TWL LRG LVL3 (GOWN DISPOSABLE) ×3
GOWN STRL REUS W/TWL XL LVL3 (GOWN DISPOSABLE) ×5 IMPLANT
NS IRRIG 1000ML POUR BTL (IV SOLUTION) ×3 IMPLANT
PACK BASIN DAY SURGERY FS (CUSTOM PROCEDURE TRAY) ×3 IMPLANT
SPEAR EYE SURG WECK-CEL (MISCELLANEOUS) ×6 IMPLANT
STRIP CLOSURE SKIN 1/4X4 (GAUZE/BANDAGES/DRESSINGS) IMPLANT
SUT 6 0 SILK T G140 8DA (SUTURE) IMPLANT
SUT MERSILENE 6-0 18IN S14 8MM (SUTURE)
SUT PLAIN 6 0 TG1408 (SUTURE) ×2 IMPLANT
SUT SILK 4 0 C 3 735G (SUTURE) IMPLANT
SUT VICRYL 6 0 S 28 (SUTURE) IMPLANT
SUT VICRYL ABS 6-0 S29 18IN (SUTURE) ×4 IMPLANT
SUTURE MERSLN 6-0 18IN S14 8MM (SUTURE) IMPLANT
SYR 10ML LL (SYRINGE) ×3 IMPLANT
SYR TB 1ML LL NO SAFETY (SYRINGE) ×3 IMPLANT
TOWEL GREEN STERILE FF (TOWEL DISPOSABLE) ×3 IMPLANT
TRAY DSU PREP LF (CUSTOM PROCEDURE TRAY) ×3 IMPLANT

## 2019-07-07 NOTE — H&P (View-Only) (Signed)
Date of examination:  06-29-19  Indication for surgery: to straighten the eyes and allow binocularity  Pertinent past medical history:  Past Medical History:  Diagnosis Date  . Abnormal Pap smear    10 years ago per pt   . Biliary colic   . Hypertension   . Hyperthyroidism   . Migraine   . Yeast infection of the vagina     Pertinent ocular history:  XT with diplopia x 1 year, myopia  Pertinent family history:  Family History  Problem Relation Age of Onset  . Cancer Paternal Grandmother        lung  . Cancer Maternal Grandmother        lung  . Stroke Maternal Grandmother   . Hypertension Mother   . Asthma Son   . Thyroid disease Maternal Aunt   . Breast cancer Maternal Aunt   . Hypertension Maternal Aunt   . Breast cancer Maternal Aunt     General:  Healthy appearing patient in no distress.    Eyes:    Acuity cc OD 20/25  OS 20/25  External: Within normal limits     Anterior segment: Within normal limits     Motility:   X(T) 35  Fundus: deferred  Refraction: -4 OU approx  Heart: Regular rate and rhythm without murmur     Lungs: Clear to auscultation       Impression:Intermittent exotropia  Plan: Lateral rectus muscle recession both eyes  Derry Skill

## 2019-07-07 NOTE — Anesthesia Procedure Notes (Signed)
Procedure Name: LMA Insertion Performed by: Jehu Mccauslin W, CRNA Pre-anesthesia Checklist: Patient identified, Emergency Drugs available, Suction available and Patient being monitored Patient Re-evaluated:Patient Re-evaluated prior to induction Oxygen Delivery Method: Circle system utilized Preoxygenation: Pre-oxygenation with 100% oxygen Induction Type: IV induction Ventilation: Mask ventilation without difficulty LMA: LMA flexible inserted LMA Size: 4.0 Number of attempts: 1 Placement Confirmation: positive ETCO2 Tube secured with: Tape Dental Injury: Teeth and Oropharynx as per pre-operative assessment        

## 2019-07-07 NOTE — Interval H&P Note (Signed)
History and Physical Interval Note:  07/07/2019 7:28 AM  Kathy Powell  has presented today for surgery, with the diagnosis of EXOTROPHIA.  The various methods of treatment have been discussed with the patient and family. After consideration of risks, benefits and other options for treatment, the patient has consented to  Procedure(s): REPAIR STRABISMUS (Bilateral) as a surgical intervention.  The patient's history has been reviewed, patient examined, no change in status, stable for surgery.  I have reviewed the patient's chart and labs.  Questions were answered to the patient's satisfaction.     Derry Skill

## 2019-07-07 NOTE — Anesthesia Postprocedure Evaluation (Signed)
Anesthesia Post Note  Patient: Kathy Powell  Procedure(s) Performed: BILATERAL STRABISMUS REPAIR (Bilateral Eye)     Patient location during evaluation: Phase II Anesthesia Type: General Level of consciousness: awake Pain management: pain level controlled Vital Signs Assessment: post-procedure vital signs reviewed and stable Respiratory status: spontaneous breathing Cardiovascular status: stable Postop Assessment: no apparent nausea or vomiting Anesthetic complications: no    Last Vitals:  Vitals:   07/07/19 0906 07/07/19 0924  BP:  115/78  Pulse: 68 68  Resp: 12 12  Temp:  36.8 C  SpO2: 100% 100%    Last Pain:  Vitals:   07/07/19 0938  TempSrc:   PainSc: 7    Pain Goal:                   Huston Foley

## 2019-07-07 NOTE — Anesthesia Preprocedure Evaluation (Addendum)
Anesthesia Evaluation  Patient identified by MRN, date of birth, ID band Patient awake    Reviewed: Allergy & Precautions, H&P , NPO status , Patient's Chart, lab work & pertinent test results, reviewed documented beta blocker date and time   Airway Mallampati: II  TM Distance: >3 FB Neck ROM: full    Dental no notable dental hx. (+) Partial Upper, Poor Dentition, Missing,    Pulmonary neg pulmonary ROS, former smoker,    Pulmonary exam normal breath sounds clear to auscultation       Cardiovascular Exercise Tolerance: Good hypertension, Pt. on medications and Pt. on home beta blockers Normal cardiovascular exam Rhythm:regular Rate:Normal     Neuro/Psych  Headaches, negative psych ROS   GI/Hepatic negative GI ROS, Neg liver ROS,   Endo/Other  Morbid obesity  Renal/GU negative Renal ROS  negative genitourinary   Musculoskeletal   Abdominal (+) + obese,   Peds  Hematology negative hematology ROS (+)   Anesthesia Other Findings   Reproductive/Obstetrics negative OB ROS                             Anesthesia Physical  Anesthesia Plan  ASA: III  Anesthesia Plan: General   Post-op Pain Management:    Induction:   PONV Risk Score and Plan: 2 and Treatment may vary due to age or medical condition, Ondansetron and Dexamethasone  Airway Management Planned: LMA  Additional Equipment:   Intra-op Plan:   Post-operative Plan:   Informed Consent: I have reviewed the patients History and Physical, chart, labs and discussed the procedure including the risks, benefits and alternatives for the proposed anesthesia with the patient or authorized representative who has indicated his/her understanding and acceptance.     Dental Advisory Given  Plan Discussed with: CRNA  Anesthesia Plan Comments:        Anesthesia Quick Evaluation

## 2019-07-07 NOTE — Op Note (Signed)
07/07/2019  8:46 AM  PATIENT:  Kathy Powell  48 y.o. female  PRE-OPERATIVE DIAGNOSIS:  Exotropia      POST-OPERATIVE DIAGNOSIS:  Exotropia     PROCEDURE:  Lateral rectus muscle recession 7.5 mm both eye(s)  SURGEON:  Lorne Skeens.Annamaria Boots, M.D.   ANESTHESIA:   general  COMPLICATIONS:None  DESCRIPTION OF PROCEDURE: The patient was taken to the operating room where She was identified by me. General anesthesia was induced without difficulty after placement of appropriate monitors. The patient was prepped and draped in standard sterile fashion. A lid speculum was placed in the right eye.  Through an inferotemporal fornix incision through conjunctiva and Tenon's fascia, the right lateral rectus muscle was engaged on a series of muscle hooks and cleared of its fascial attachments. The tendon was secured with a double-armed 6-0 Vicryl suture with a double locking bite at each border of the muscle, 1 mm from the insertion. The muscle was disinserted, and was reattached to sclera at a measured distance of 7.5 millimeters posterior to the original insertion, using direct scleral passes in crossed swords fashion.  The suture ends were tied securely after the position of the muscle had been checked and found to be accurate. Conjunctiva was closed with 2 6-0 Vicryl sutures.  The speculum was transferred to the left eye, where an identical procedure was performed, again effecting a 7.5 millimeters recession of the lateral rectus muscle. TobraDex ointment was placed in both eyes. The patient was awakened without difficulty and taken to the recovery room in stable condition, having suffered no intraoperative or immediate postoperative complications.  Lorne Skeens. Octavius Shin M.D.    PATIENT DISPOSITION:  PACU - hemodynamically stable.

## 2019-07-07 NOTE — Discharge Instructions (Signed)
No NSAIDS before 1:45pm today.  Post Anesthesia Home Care Instructions  Activity: Get plenty of rest for the remainder of the day. A responsible individual must stay with you for 24 hours following the procedure.  For the next 24 hours, DO NOT: -Drive a car -Paediatric nurse -Drink alcoholic beverages -Take any medication unless instructed by your physician -Make any legal decisions or sign important papers.  Meals: Start with liquid foods such as gelatin or soup. Progress to regular foods as tolerated. Avoid greasy, spicy, heavy foods. If nausea and/or vomiting occur, drink only clear liquids until the nausea and/or vomiting subsides. Call your physician if vomiting continues.  Special Instructions/Symptoms: Your throat may feel dry or sore from the anesthesia or the breathing tube placed in your throat during surgery. If this causes discomfort, gargle with warm salt water. The discomfort should disappear within 24 hours.  If you had a scopolamine patch placed behind your ear for the management of post- operative nausea and/or vomiting:  1. The medication in the patch is effective for 72 hours, after which it should be removed.  Wrap patch in a tissue and discard in the trash. Wash hands thoroughly with soap and water. 2. You may remove the patch earlier than 72 hours if you experience unpleasant side effects which may include dry mouth, dizziness or visual disturbances. 3. Avoid touching the patch. Wash your hands with soap and water after contact with the patch.    Dr. Janee Morn Postop Instructions  Diet: Clear liquids, advance to soft foods then regular diet as tolerated by this evening.  Pain control:   1)  Ibuprofen 600 mg by mouth every 6-8 hours as needed for pain  2)  Ice pack/cold compress to operated eye(s) as desired  Eye medications:  Tobradex or Zylet eye ointment 1/2 inch in operated eye(s) twice a day if directed to do so by Dr. Annamaria Boots  Activity: No swimming for 1  week.  It is OK to let water run over the face and eyes while showering or taking a bath, even during the first week.  No other restriction on exercise or activity.   Call Dr. Janee Morn office [phone] with any problems or concerns.

## 2019-07-07 NOTE — Transfer of Care (Signed)
Immediate Anesthesia Transfer of Care Note  Patient: Kathy Powell  Procedure(s) Performed: BILATERAL STRABISMUS REPAIR (Bilateral Eye)  Patient Location: PACU  Anesthesia Type:General  Level of Consciousness: awake and sedated  Airway & Oxygen Therapy: Patient Spontanous Breathing and Patient connected to face mask oxygen  Post-op Assessment: Report given to RN and Post -op Vital signs reviewed and stable  Post vital signs: Reviewed and stable  Last Vitals:  Vitals Value Taken Time  BP    Temp    Pulse 74 07/07/19 0838  Resp 15 07/07/19 0838  SpO2 99 % 07/07/19 0838  Vitals shown include unvalidated device data.  Last Pain:  Vitals:   07/07/19 0646  TempSrc: Oral  PainSc: 0-No pain         Complications: No apparent anesthesia complications

## 2019-07-07 NOTE — H&P (Signed)
Date of examination:  06-29-19  Indication for surgery: to straighten the eyes and allow binocularity  Pertinent past medical history:  Past Medical History:  Diagnosis Date  . Abnormal Pap smear    10 years ago per pt   . Biliary colic   . Hypertension   . Hyperthyroidism   . Migraine   . Yeast infection of the vagina     Pertinent ocular history:  XT with diplopia x 1 year, myopia  Pertinent family history:  Family History  Problem Relation Age of Onset  . Cancer Paternal Grandmother        lung  . Cancer Maternal Grandmother        lung  . Stroke Maternal Grandmother   . Hypertension Mother   . Asthma Son   . Thyroid disease Maternal Aunt   . Breast cancer Maternal Aunt   . Hypertension Maternal Aunt   . Breast cancer Maternal Aunt     General:  Healthy appearing patient in no distress.    Eyes:    Acuity cc OD 20/25  OS 20/25  External: Within normal limits     Anterior segment: Within normal limits     Motility:   X(T) 35  Fundus: deferred  Refraction: -4 OU approx  Heart: Regular rate and rhythm without murmur     Lungs: Clear to auscultation       Impression:Intermittent exotropia  Plan: Lateral rectus muscle recession both eyes  Derry Skill

## 2019-07-10 ENCOUNTER — Encounter (HOSPITAL_BASED_OUTPATIENT_CLINIC_OR_DEPARTMENT_OTHER): Payer: Self-pay | Admitting: Ophthalmology

## 2019-08-21 ENCOUNTER — Other Ambulatory Visit: Payer: Self-pay | Admitting: Internal Medicine

## 2019-08-21 DIAGNOSIS — E0590 Thyrotoxicosis, unspecified without thyrotoxic crisis or storm: Secondary | ICD-10-CM

## 2019-08-30 ENCOUNTER — Other Ambulatory Visit (HOSPITAL_COMMUNITY)
Admission: RE | Admit: 2019-08-30 | Discharge: 2019-08-30 | Disposition: A | Discharge status: Home / Self Care | Payer: No Typology Code available for payment source | Source: Ambulatory Visit | Attending: Radiology | Admitting: Radiology

## 2019-08-30 ENCOUNTER — Ambulatory Visit
Admission: RE | Admit: 2019-08-30 | Discharge: 2019-08-30 | Disposition: A | Discharge status: Home / Self Care | Payer: No Typology Code available for payment source | Source: Ambulatory Visit | Attending: Internal Medicine | Admitting: Internal Medicine

## 2019-08-30 DIAGNOSIS — E041 Nontoxic single thyroid nodule: Secondary | ICD-10-CM | POA: Diagnosis not present

## 2019-08-30 DIAGNOSIS — E0590 Thyrotoxicosis, unspecified without thyrotoxic crisis or storm: Secondary | ICD-10-CM

## 2019-08-31 LAB — CYTOLOGY - NON PAP

## 2019-09-13 ENCOUNTER — Other Ambulatory Visit: Payer: No Typology Code available for payment source

## 2019-09-25 ENCOUNTER — Other Ambulatory Visit: Payer: Self-pay | Admitting: Internal Medicine

## 2019-09-25 DIAGNOSIS — E079 Disorder of thyroid, unspecified: Secondary | ICD-10-CM

## 2019-09-25 DIAGNOSIS — E0789 Other specified disorders of thyroid: Secondary | ICD-10-CM

## 2019-10-03 ENCOUNTER — Other Ambulatory Visit: Payer: No Typology Code available for payment source

## 2019-10-12 ENCOUNTER — Ambulatory Visit
Admission: EM | Admit: 2019-10-12 | Discharge: 2019-10-12 | Disposition: A | Discharge status: Home / Self Care | Payer: No Typology Code available for payment source | Attending: Physician Assistant | Admitting: Physician Assistant

## 2019-10-12 DIAGNOSIS — M7662 Achilles tendinitis, left leg: Secondary | ICD-10-CM

## 2019-10-12 MED ORDER — PREDNISONE 50 MG PO TABS: 50.0000 mg | ORAL_TABLET | Freq: Every day | ORAL | 0 refills | Status: DC

## 2019-10-12 NOTE — Discharge Instructions (Signed)
Start prednisone as directed. Continue ice compress, ankle braces/boots. Stretching exercises. Follow up with orthopedics as scheduled for reevaluation needed.

## 2019-10-12 NOTE — ED Triage Notes (Signed)
Pt c/o lt ankle pain x4 days. States hx on achillies tendinitis, wearing the boot stabilizes her ankle but doesn't help the pain

## 2019-10-12 NOTE — ED Provider Notes (Signed)
[facility]    CSN: [id number] Arrival date & time: 10/12/19  1332      History   Chief Complaint Chief Complaint  Patient presents with  . Ankle Pain    HPI Kathy Powell is a 48 y.o. female.   48 year old female comes in for 4 day history of left ankle pain. Pain is to the left medial/posterior ankle that is worse with movement.  Denies injury/trauma.  Has minimal swelling.  States has history of Achilles tendinitis, and feels the same.  Denies numbness, tingling.  She recently started weight loss program, and has had significant increase in walking.  She has been trying ice, heat, and cold mood stabilizer without much relief.     Past Medical History:  Diagnosis Date  . Abnormal Pap smear    10 years ago per pt   . Biliary colic   . Hypertension   . Hyperthyroidism   . Migraine   . Yeast infection of the vagina     Patient Active Problem List   Diagnosis Date Noted  . Status post arthroscopy of right knee 09/15/2018  . Chronic pain of right knee 08/15/2018    Past Surgical History:  Procedure Laterality Date  . CERCLAGE REMOVAL     insertion and removal   . CHOLECYSTECTOMY N/A 03/30/2018   Procedure: LAPAROSCOPIC CHOLECYSTECTOMY;  Surgeon: Clovis Riley, MD;  Location: [facility];  Service: General;  Laterality: N/A;  . MULTIPLE TOOTH EXTRACTIONS    . Right knee arthroscopy    . STRABISMUS SURGERY Bilateral 07/07/2019   Procedure: BILATERAL STRABISMUS REPAIR;  Surgeon: Everitt Amber, MD;  Location: [facility];  Service: Ophthalmology;  Laterality: Bilateral;  . TUBAL LIGATION    . WISDOM TOOTH EXTRACTION      OB History    Gravida  6   Para  4   Term      Preterm      AB  2   Living  4     SAB  2   TAB      Ectopic      Multiple      Live Births  4            Home Medications    Prior to Admission medications   Medication Sig Start Date End Date Taking? Authorizing Provider   hydrochlorothiazide (HYDRODIURIL) 25 MG tablet Take 25 mg by mouth daily.    [provider]  ibuprofen (ADVIL,MOTRIN) 200 MG tablet Take 400-600 mg by mouth daily as needed (migraines).    [provider]  methimazole (TAPAZOLE) 5 MG tablet  04/25/19   [provider]  predniSONE (DELTASONE) 50 MG tablet Take 1 tablet (50 mg total) by mouth daily with breakfast. 10/12/19   Tasia Catchings, Amy V, PA-C  propranolol (INDERAL) 40 MG tablet Take 40 mg by mouth 2 (two) times daily.    [provider]    Family History Family History  Problem Relation Age of Onset  . Cancer Paternal Grandmother        lung  . Cancer Maternal Grandmother        lung  . Stroke Maternal Grandmother   . Hypertension Mother   . Asthma Son   . Thyroid disease Maternal Aunt   . Breast cancer Maternal Aunt   . Hypertension Maternal Aunt   . Breast cancer Maternal Aunt     Social History Social History   Tobacco Use  .  Smoking status: Former Smoker    Quit date: 12/07/2017    Years since quitting: 1.8  . Smokeless tobacco: Never Used  Substance Use Topics  . Alcohol use: Yes    Comment: occ  . Drug use: Not Currently     Allergies   Aspirin   Review of Systems Review of Systems  Reason unable to perform ROS: See HPI as above.     Physical Exam Triage Vital Signs ED Triage Vitals [10/12/19 1359]  Enc Vitals Group     BP 132/75     Pulse Rate 64     Resp 20     Temp 98.6 F (37 C)     Temp Source Oral     SpO2 98 %     Weight      Height      Head Circumference      Peak Flow      Pain Score 10     Pain Loc      Pain Edu?      Excl. in [place]?    No data found.  Updated Vital Signs BP 132/75 (BP Location: Left Arm)   Pulse 64   Temp 98.6 F (37 C) (Oral)   Resp 20   SpO2 98%   Physical Exam Constitutional:      General: She is not in acute distress.    Appearance: She is well-developed. She is not diaphoretic.  HENT:     Head: Normocephalic and  atraumatic.  Eyes:     Conjunctiva/sclera: Conjunctivae normal.     Pupils: Pupils are equal, round, and reactive to light.  Pulmonary:     Effort: Pulmonary effort is normal. No respiratory distress.  Musculoskeletal:     Comments: No swelling, erythema, warmth, contusion seen.  Patient tender palpation of medial and posterior left ankle.  No tenderness to palpation of the foot.  No tenderness to palpation along plantar surface.  Full range of motion of ankle with worsening pain during dorsiflexion and inversion.  Strength normal ankle bilaterally.  Sensation intact ankle bilaterally.  Pedal pulse 2+, cap refill less than 2 seconds.  Skin:    General: Skin is warm and dry.  Neurological:     Mental Status: She is alert and oriented to person, place, and time.      UC Treatments / Results  Labs (all labs ordered are listed, but only abnormal results are displayed) Labs Reviewed - No data to display  EKG   Radiology No results found.  Procedures Procedures (including critical care time)  Medications Ordered in UC Medications - No data to display  Initial Impression / Assessment and Plan / UC Course  I have reviewed the triage vital signs and the nursing notes.  Pertinent labs & imaging results that were available during my care of the patient were reviewed by me and considered in my medical decision making (see chart for details).    History and exam consistent with tendinitis.  Will start patient on prednisone.  Continue ice compress, ankle splint.  Patient to follow-up with orthopedics for further evaluation if symptoms do not improve.  Final Clinical Impressions(s) / UC Diagnoses   Final diagnoses:  Tendonitis, Achilles, left   ED Prescriptions    Medication Sig Dispense Auth. Provider   predniSONE (DELTASONE) 50 MG tablet Take 1 tablet (50 mg total) by mouth daily with breakfast. 5 tablet Ok Edwards, PA-C     PDMP not reviewed this encounter.  Ok Edwards, PA-C  10/12/19 [phone]

## 2019-12-08 DIAGNOSIS — C73 Malignant neoplasm of thyroid gland: Secondary | ICD-10-CM

## 2019-12-08 HISTORY — DX: Malignant neoplasm of thyroid gland: C73

## 2020-02-17 ENCOUNTER — Ambulatory Visit: Payer: No Typology Code available for payment source

## 2020-08-02 ENCOUNTER — Other Ambulatory Visit: Payer: Self-pay | Admitting: Internal Medicine

## 2020-08-02 ENCOUNTER — Other Ambulatory Visit (HOSPITAL_COMMUNITY): Payer: Self-pay | Admitting: Internal Medicine

## 2020-08-02 DIAGNOSIS — E041 Nontoxic single thyroid nodule: Secondary | ICD-10-CM

## 2020-08-08 ENCOUNTER — Ambulatory Visit (HOSPITAL_COMMUNITY)
Admission: RE | Admit: 2020-08-08 | Discharge: 2020-08-08 | Disposition: A | Discharge status: Home / Self Care | Payer: No Typology Code available for payment source | Source: Ambulatory Visit | Attending: Internal Medicine | Admitting: Internal Medicine

## 2020-08-08 ENCOUNTER — Other Ambulatory Visit: Payer: Self-pay

## 2020-08-08 DIAGNOSIS — E041 Nontoxic single thyroid nodule: Secondary | ICD-10-CM | POA: Diagnosis present

## 2020-08-17 ENCOUNTER — Other Ambulatory Visit (HOSPITAL_COMMUNITY): Payer: Self-pay | Admitting: Internal Medicine

## 2020-08-17 DIAGNOSIS — E0590 Thyrotoxicosis, unspecified without thyrotoxic crisis or storm: Secondary | ICD-10-CM

## 2020-08-28 ENCOUNTER — Other Ambulatory Visit: Payer: Self-pay | Admitting: Internal Medicine

## 2020-08-28 DIAGNOSIS — E042 Nontoxic multinodular goiter: Secondary | ICD-10-CM

## 2020-09-04 ENCOUNTER — Other Ambulatory Visit: Payer: No Typology Code available for payment source

## 2020-09-09 ENCOUNTER — Other Ambulatory Visit: Payer: Self-pay

## 2020-09-09 ENCOUNTER — Encounter (HOSPITAL_COMMUNITY)
Admission: RE | Admit: 2020-09-09 | Discharge: 2020-09-09 | Disposition: A | Discharge status: Home / Self Care | Payer: No Typology Code available for payment source | Source: Ambulatory Visit | Attending: Internal Medicine | Admitting: Internal Medicine

## 2020-09-09 DIAGNOSIS — E0590 Thyrotoxicosis, unspecified without thyrotoxic crisis or storm: Secondary | ICD-10-CM | POA: Diagnosis present

## 2020-09-09 LAB — HCG, SERUM, QUALITATIVE: Preg, Serum: NEGATIVE

## 2020-09-09 MED ORDER — SODIUM IODIDE I 131 CAPSULE
28.0000 | Freq: Once | INTRAVENOUS | Status: AC | PRN
  Administered 2020-09-09: 28 via ORAL

## 2020-10-21 ENCOUNTER — Other Ambulatory Visit: Payer: Self-pay

## 2020-10-21 ENCOUNTER — Ambulatory Visit (INDEPENDENT_AMBULATORY_CARE_PROVIDER_SITE_OTHER): Payer: No Typology Code available for payment source

## 2020-10-21 ENCOUNTER — Other Ambulatory Visit: Payer: Self-pay | Admitting: Family Medicine

## 2020-10-21 DIAGNOSIS — R519 Headache, unspecified: Secondary | ICD-10-CM | POA: Diagnosis not present

## 2020-10-21 MED ORDER — IOHEXOL 300 MG/ML  SOLN
100.0000 mL | Freq: Once | INTRAMUSCULAR | Status: AC | PRN
  Administered 2020-10-21: 75 mL via INTRAVENOUS

## 2020-10-22 ENCOUNTER — Other Ambulatory Visit: Payer: Self-pay | Admitting: Family Medicine

## 2020-10-22 DIAGNOSIS — R9089 Other abnormal findings on diagnostic imaging of central nervous system: Secondary | ICD-10-CM

## 2020-10-28 ENCOUNTER — Ambulatory Visit (INDEPENDENT_AMBULATORY_CARE_PROVIDER_SITE_OTHER): Payer: No Typology Code available for payment source

## 2020-10-28 ENCOUNTER — Other Ambulatory Visit: Payer: Self-pay

## 2020-10-28 DIAGNOSIS — R9089 Other abnormal findings on diagnostic imaging of central nervous system: Secondary | ICD-10-CM

## 2020-10-28 MED ORDER — GADOBUTROL 1 MMOL/ML IV SOLN
10.0000 mL | Freq: Once | INTRAVENOUS | Status: AC | PRN
  Administered 2020-10-28: 10 mL via INTRAVENOUS

## 2020-12-10 IMAGING — CT CT HEAD WO/W CM
3 of 6 series · 15 of 47 positions shown, 18 images · IV contrast (omnipaque)
Comparison: Brain MRI 02/18/2013.

CLINICAL DATA: Occipital headache. Additional history provided by
scanning technologist: Patient reports occipital headache for 2
weeks, now radiating into neck and left ear, nausea. Patient reports
a history of thyroid cancer with surgery and radiation.

EXAM:
CT HEAD WITHOUT AND WITH CONTRAST
TECHNIQUE: Contiguous axial images were obtained from the base of the skull
through the vertex without and with intravenous contrast
CONTRAST:  75mL OMNIPAQUE IOHEXOL 300 MG/ML  SOLN

[Series 2: head wo soft · axial · 0.46mm/px · z∈[-184,-40]mm · 10 of 35 slices shown, 13 images]
[im 3/35  brain]
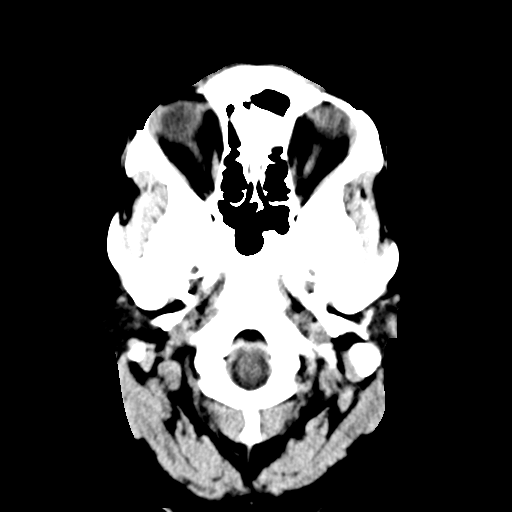
[im 3/35  bone]
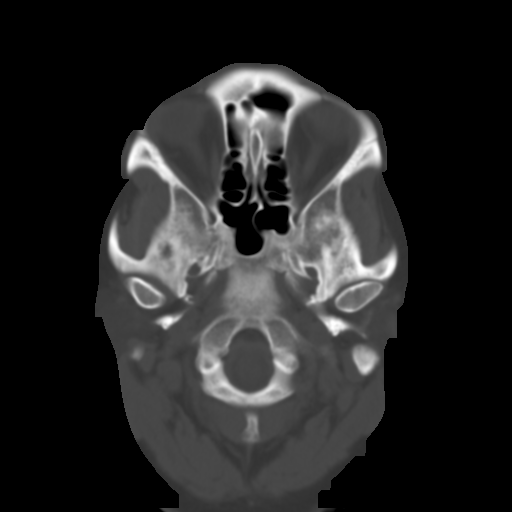
[im 5/35  brain]
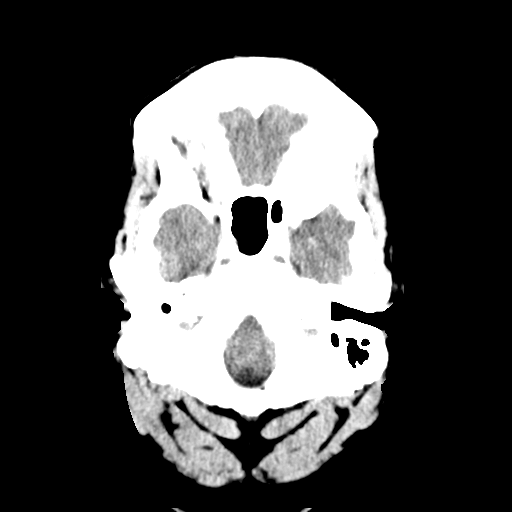
[im 10/35  brain]
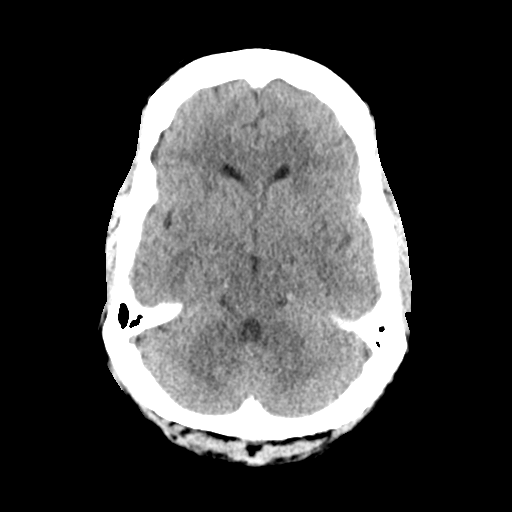
[im 13/35  brain]
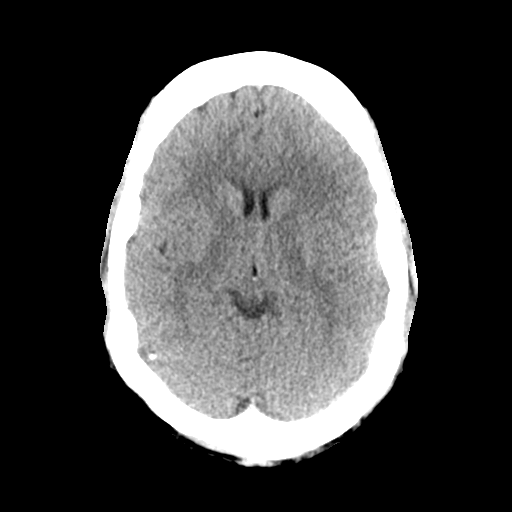
[im 15/35  brain]
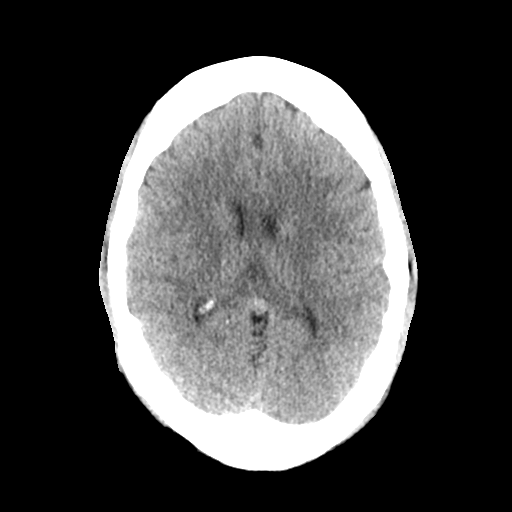
[im 15/35  bone]
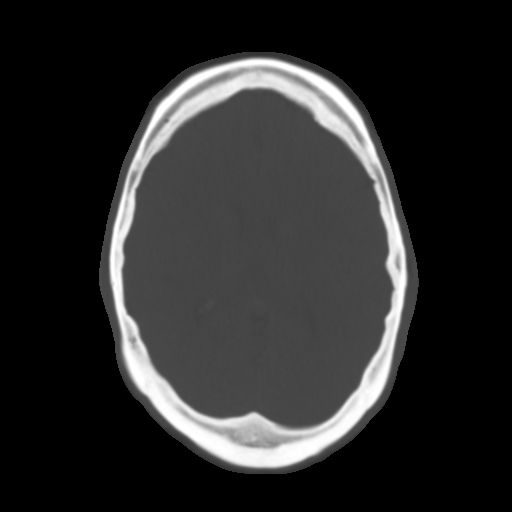
[im 20/35  brain]
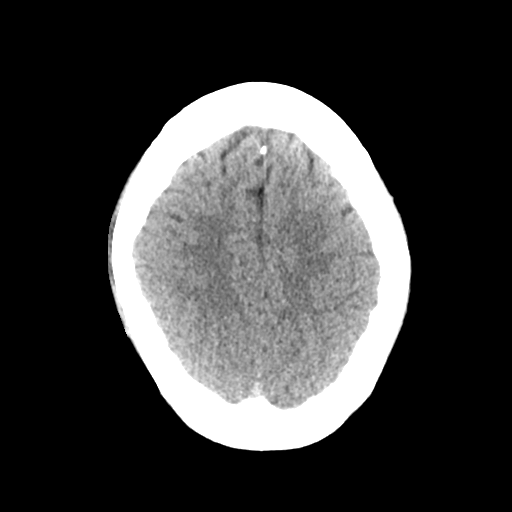
[im 22/35  brain]
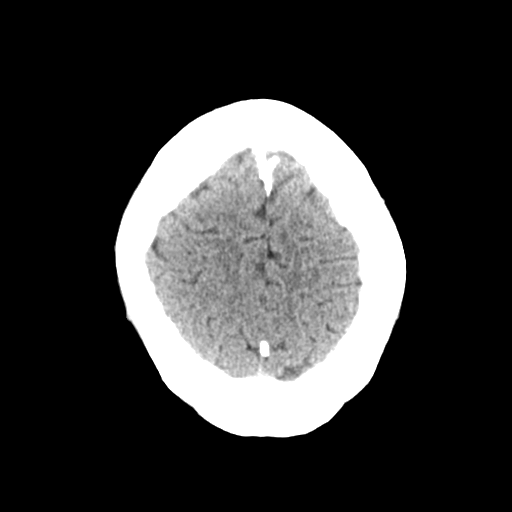
[im 25/35  brain]
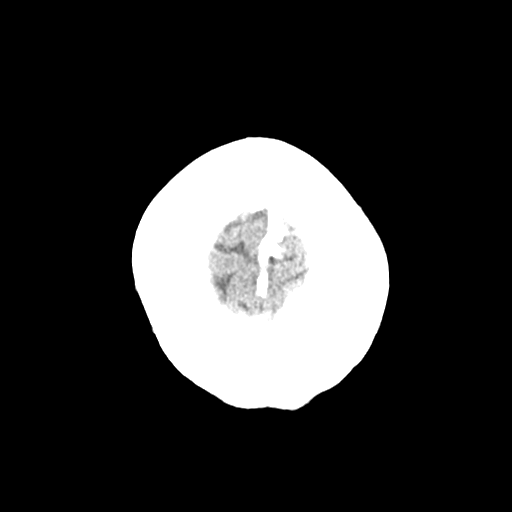
[im 30/35  brain]
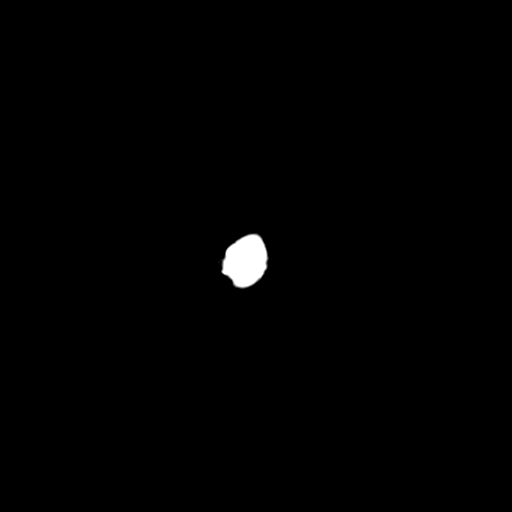
[im 30/35  bone]
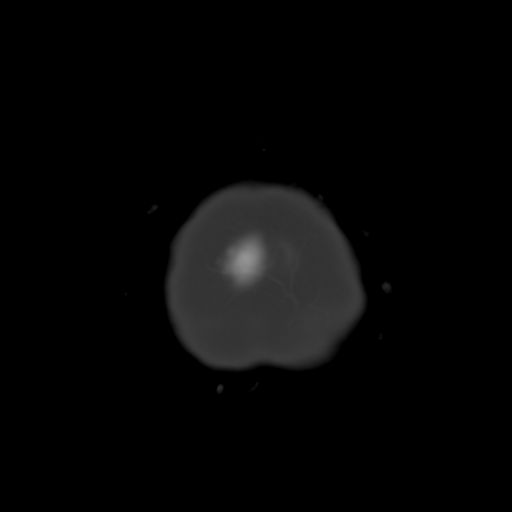
[im 32/35  brain]
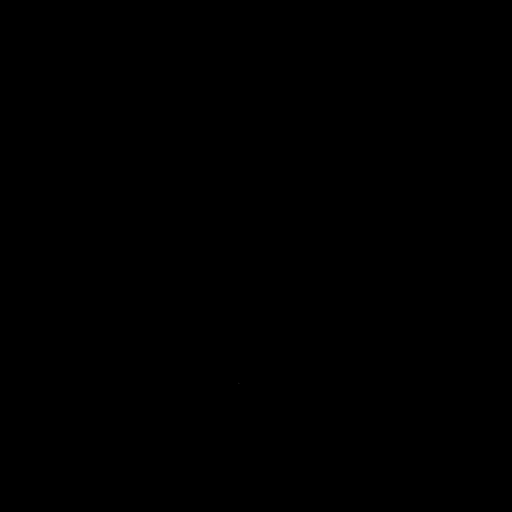

[Series 4: cor head wo · coronal · 0.32mm/px · 3 of 69 slices shown]
[im 18/69  brain]
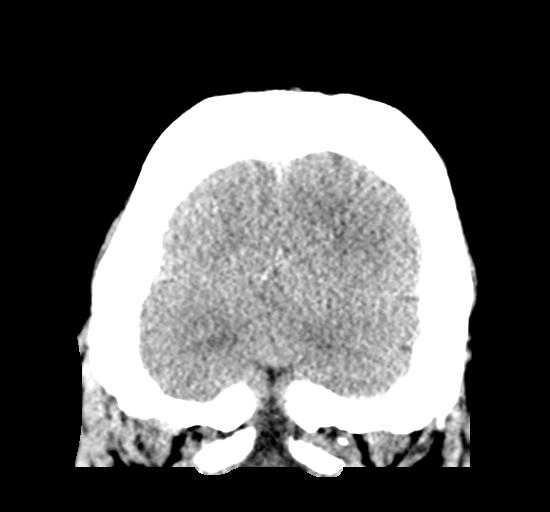
[im 35/69  brain]
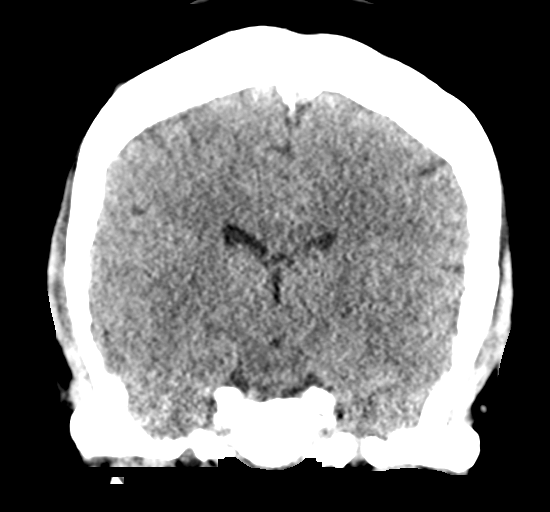
[im 52/69  brain]
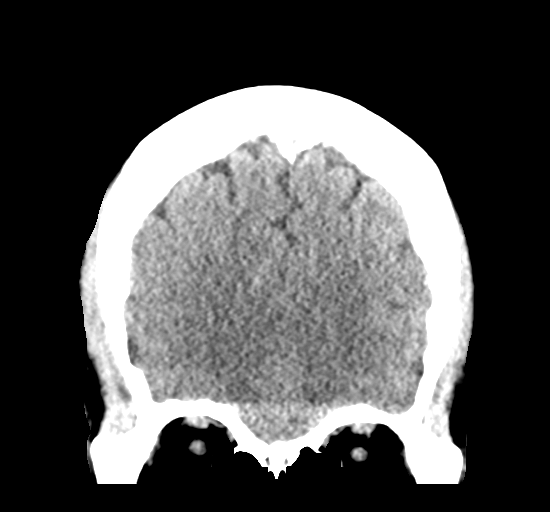

[Series 9: sag head w soft · sagittal · 0.34mm/px · 2 of 57 slices shown]
[im 19/57  brain]
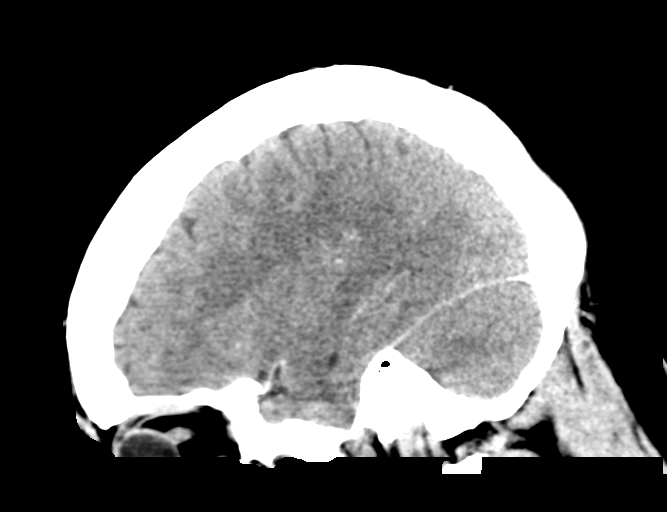
[im 38/57  brain]
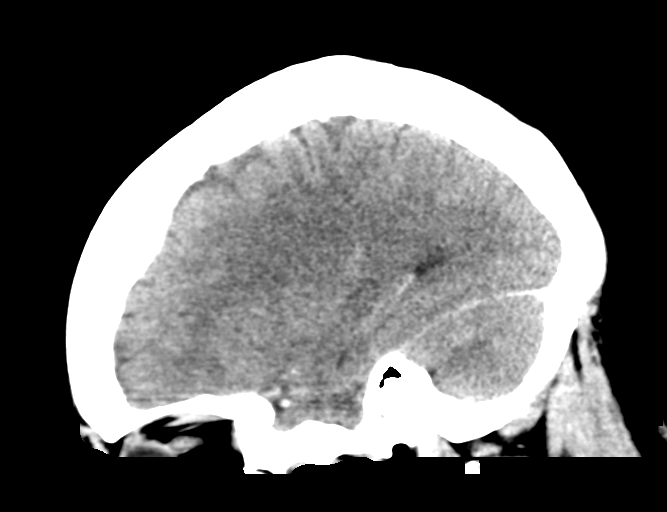

[15 of 47 positions shown; findings below may reference images not displayed]

FINDINGS: Brain:

Cerebral volume is normal for age.

There is no acute intracranial hemorrhage.

No demarcated cortical infarct.

No extra-axial fluid collection.

No midline shift.

Partially empty sella turcica.

Apparent thickening of the distal pituitary stalk measuring up to 4
mm. In retrospect, there was suggestion of a possible nodular lesion
at this site on the prior noncontrast brain MRI of 02/18/2013.

No abnormal intracranial enhancement is identified elsewhere.

Vascular: No hyperdense vessel on precontrast imaging. Expected
vascular enhancement.

Skull: Normal. Negative for fracture or focal lesion.

Sinuses/Orbits: Visualized orbits show no acute finding. No
significant paranasal sinus disease at the imaged levels.
IMPRESSION: Apparent thickening of the distal pituitary stalk measuring up to 4
mm. In retrospect, there was suggestion of a nodular lesion at this
site on the prior non-contrast brain MRI of 02/18/2013. This may
reflect a Rathke's cleft cyst, but is incompletely characterized on
the current CT and prior non-contrast MRI. Given the provided
history of headaches, consider a pituitary protocol brain MRI with
contrast for further evaluation.

Partially empty sella turcica. This finding is very commonly
incidental, but can be associated with idiopathic intracranial
hypertension.

Otherwise unremarkable CT appearance of the brain.

## 2021-03-11 ENCOUNTER — Encounter: Payer: Self-pay | Admitting: Gastroenterology

## 2021-05-01 ENCOUNTER — Ambulatory Visit (AMBULATORY_SURGERY_CENTER): Payer: Self-pay

## 2021-05-01 ENCOUNTER — Other Ambulatory Visit: Payer: Self-pay

## 2021-05-01 VITALS — Ht 66.0 in | Wt 244.0 lb

## 2021-05-01 DIAGNOSIS — Z1211 Encounter for screening for malignant neoplasm of colon: Secondary | ICD-10-CM

## 2021-05-01 MED ORDER — GOLYTELY 236 G PO SOLR: 4000.0000 mL | ORAL | 0 refills | Status: DC

## 2021-05-01 NOTE — Progress Notes (Signed)
No egg or soy allergy known to patient  No issues with past sedation with any surgeries or procedures Patient denies ever being told they had issues or difficulty with intubation  No FH of Malignant Hyperthermia No diet pills per patient No home 02 use per patient  No blood thinners per patient  Pt denies issues with constipation  No A fib or A flutter  EMMI video via MyChart  COVID 19 guidelines implemented in PV today with Pt and RN   NO PA's for preps discussed with pt in PV today  Discussed with pt there will be an out-of-pocket cost for prep and that varies from $0 to 70 dollars  Due to the COVID-19 pandemic we are asking patients to follow certain guidelines.  Pt aware of COVID protocols and [facility] guidelines

## 2021-05-16 ENCOUNTER — Encounter: Payer: Self-pay | Admitting: Gastroenterology

## 2021-05-16 ENCOUNTER — Other Ambulatory Visit: Payer: Self-pay

## 2021-05-16 ENCOUNTER — Ambulatory Visit (AMBULATORY_SURGERY_CENTER): Payer: No Typology Code available for payment source | Admitting: Gastroenterology

## 2021-05-16 VITALS — BP 145/85 | HR 65 | Temp 98.3°F | Resp 15 | Ht 66.0 in | Wt 244.0 lb

## 2021-05-16 DIAGNOSIS — Z1211 Encounter for screening for malignant neoplasm of colon: Secondary | ICD-10-CM

## 2021-05-16 MED ORDER — SODIUM CHLORIDE 0.9 % IV SOLN: 500.0000 mL | Freq: Once | INTRAVENOUS | Status: AC

## 2021-05-16 NOTE — Patient Instructions (Signed)
Read all of the handouts given to you by your recovery room nurse.  YOU HAD AN ENDOSCOPIC PROCEDURE TODAY AT [facility]:   Refer to the procedure report that was given to you for any specific questions about what was found during the examination.  If the procedure report does not answer your questions, please call your gastroenterologist to clarify.  If you requested that your care partner not be given the details of your procedure findings, then the procedure report has been included in a sealed envelope for you to review at your convenience later.  YOU SHOULD EXPECT: Some feelings of bloating in the abdomen. Passage of more gas than usual.  Walking can help get rid of the air that was put into your GI tract during the procedure and reduce the bloating. If you had a lower endoscopy (such as a colonoscopy or flexible sigmoidoscopy) you may notice spotting of blood in your stool or on the toilet paper. If you underwent a bowel prep for your procedure, you may not have a normal bowel movement for a few days.  Please Note:  You might notice some irritation and congestion in your nose or some drainage.  This is from the oxygen used during your procedure.  There is no need for concern and it should clear up in a day or so.  SYMPTOMS TO REPORT IMMEDIATELY:  Following lower endoscopy (colonoscopy or flexible sigmoidoscopy):  Excessive amounts of blood in the stool  Significant tenderness or worsening of abdominal pains  Swelling of the abdomen that is new, acute  Fever of 100F or higher   For urgent or emergent issues, a gastroenterologist can be reached at any hour by calling [phone]. Do not use MyChart messaging for urgent concerns.    DIET:  We do recommend a small meal at first, but then you may proceed to your regular diet.  Drink plenty of fluids but you should avoid alcoholic beverages for 24 hours.  ACTIVITY:  You should plan to take it easy for the rest of today and  you should NOT DRIVE or use heavy machinery until tomorrow (because of the sedation medicines used during the test).    FOLLOW UP: Our staff will call the number listed on your records 48-72 hours following your procedure to check on you and address any questions or concerns that you may have regarding the information given to you following your procedure. If we do not reach you, we will leave a message.  We will attempt to reach you two times.  During this call, we will ask if you have developed any symptoms of COVID 19. If you develop any symptoms (ie: fever, flu-like symptoms, shortness of breath, cough etc.) before then, please call [phone].  If you test positive for Covid 19 in the 2 weeks post procedure, please call and report this information to [place].     SIGNATURES/CONFIDENTIALITY: You and/or your care partner have signed paperwork which will be entered into your electronic medical record.  These signatures attest to the fact that that the information above on your After Visit Summary has been reviewed and is understood.  Full responsibility of the confidentiality of this discharge information lies with you and/or your care-partner.

## 2021-05-16 NOTE — Progress Notes (Signed)
Report to PACU, RN, vss, BBS= Clear.  

## 2021-05-16 NOTE — Progress Notes (Signed)
Pt's states no medical or surgical changes since previsit or office visit. 

## 2021-05-16 NOTE — Op Note (Signed)
[facility] Patient Name: Kathy Powell Procedure Date: 05/16/2021 11:36 AM MRN: [id number] Endoscopist: [place]. Loletha Carrow , MD Age: 50 Referring MD:  Date of Birth: 10-Oct-1971 Gender: Female Account #: [id number] Procedure:                Colonoscopy Indications:              Screening for colorectal malignant neoplasm, This                            is the patient's first colonoscopy Medicines:                Monitored Anesthesia Care Procedure:                Pre-Anesthesia Assessment:                           - Prior to the procedure, a History and Physical                            was performed, and patient medications and                            allergies were reviewed. The patient's tolerance of                            previous anesthesia was also reviewed. The risks                            and benefits of the procedure and the sedation                            options and risks were discussed with the patient.                            All questions were answered, and informed consent                            was obtained. Prior Anticoagulants: The patient has                            taken no previous anticoagulant or antiplatelet                            agents. ASA Grade Assessment: II - A patient with                            mild systemic disease. After reviewing the risks                            and benefits, the patient was deemed in                            satisfactory condition to undergo the procedure.  After obtaining informed consent, the colonoscope                            was passed under direct vision. Throughout the                            procedure, the patient's blood pressure, pulse, and                            oxygen saturations were monitored continuously. The                            Olympus PCF-H190DL ([id number]) Colonoscope was                            introduced through the  anus and advanced to the the                            cecum, identified by appendiceal orifice and                            ileocecal valve. The colonoscopy was performed                            without difficulty. The patient tolerated the                            procedure well. The quality of the bowel                            preparation was good. The ileocecal valve,                            appendiceal orifice, and rectum were photographed. Scope In: 11:45:22 AM Scope Out: 11:57:21 AM Scope Withdrawal Time: 0 hours 8 minutes 42 seconds  Total Procedure Duration: 0 hours 11 minutes 59 seconds  Findings:                 The perianal and digital rectal examinations were                            normal.                           The exam was otherwise without abnormality on                            direct and retroflexion views. Complications:            No immediate complications. Estimated Blood Loss:     Estimated blood loss: none. Impression:               - The examination was otherwise normal on direct                            and retroflexion views.                           -  No specimens collected. Recommendation:           - Patient has a contact number available for                            emergencies. The signs and symptoms of potential                            delayed complications were discussed with the                            patient. Return to normal activities tomorrow.                            Written discharge instructions were provided to the                            patient.                           - Resume previous diet.                           - Continue present medications.                           - Repeat colonoscopy in 10 years for screening                            purposes. Rigley Niess L. Loletha Carrow, MD 05/16/2021 12:01:05 PM This report has been signed electronically.

## 2021-05-20 ENCOUNTER — Telehealth: Payer: Self-pay | Admitting: *Deleted

## 2021-05-20 NOTE — Telephone Encounter (Signed)
First attempt, left VM.  

## 2021-05-20 NOTE — Telephone Encounter (Signed)
  Follow up Call-  Call back number 05/16/2021  Post procedure Call Back phone  # [phone]  Permission to leave phone message Yes  Some recent data might be hidden     Patient questions:  Do you have a fever, pain , or abdominal swelling? No. Pain Score  0 *  Have you tolerated food without any problems? Yes.    Have you been able to return to your normal activities? Yes.    Do you have any questions about your discharge instructions: Diet   No. Medications  No. Follow up visit  No.  Do you have questions or concerns about your Care? No.  Actions: * If pain score is 4 or above: No action needed, pain <4.

## 2022-02-10 ENCOUNTER — Encounter (HOSPITAL_BASED_OUTPATIENT_CLINIC_OR_DEPARTMENT_OTHER): Payer: Self-pay | Admitting: Emergency Medicine

## 2022-02-10 ENCOUNTER — Other Ambulatory Visit: Payer: Self-pay

## 2022-02-10 ENCOUNTER — Emergency Department (HOSPITAL_BASED_OUTPATIENT_CLINIC_OR_DEPARTMENT_OTHER)
Admission: EM | Admit: 2022-02-10 | Discharge: 2022-02-11 | Disposition: A | Discharge status: Home / Self Care | Payer: No Typology Code available for payment source | Attending: Emergency Medicine | Admitting: Emergency Medicine

## 2022-02-10 DIAGNOSIS — R1013 Epigastric pain: Secondary | ICD-10-CM | POA: Diagnosis not present

## 2022-02-10 DIAGNOSIS — Z85850 Personal history of malignant neoplasm of thyroid: Secondary | ICD-10-CM | POA: Diagnosis not present

## 2022-02-10 DIAGNOSIS — Z79899 Other long term (current) drug therapy: Secondary | ICD-10-CM | POA: Diagnosis not present

## 2022-02-10 DIAGNOSIS — R197 Diarrhea, unspecified: Secondary | ICD-10-CM | POA: Diagnosis not present

## 2022-02-10 DIAGNOSIS — I10 Essential (primary) hypertension: Secondary | ICD-10-CM | POA: Insufficient documentation

## 2022-02-10 DIAGNOSIS — R109 Unspecified abdominal pain: Secondary | ICD-10-CM

## 2022-02-10 LAB — COMPREHENSIVE METABOLIC PANEL
ALT: 15 U/L (ref 0–44)
AST: 13 U/L — ABNORMAL LOW (ref 15–41)
Albumin: 4.5 g/dL (ref 3.5–5.0)
Alkaline Phosphatase: 119 U/L (ref 38–126)
Anion gap: 9 (ref 5–15)
BUN: 10 mg/dL (ref 6–20)
CO2: 26 mmol/L (ref 22–32)
Calcium: 10 mg/dL (ref 8.9–10.3)
Chloride: 106 mmol/L (ref 98–111)
Creatinine, Ser: 1.12 mg/dL — ABNORMAL HIGH (ref 0.44–1.00)
GFR, Estimated: >60 mL/min (ref >60)
Glucose, Bld: 108 mg/dL — ABNORMAL HIGH (ref 70–99)
Potassium: 3.7 mmol/L (ref 3.5–5.1)
Sodium: 141 mmol/L (ref 135–145)
Total Bilirubin: 0.5 mg/dL (ref 0.3–1.2)
Total Protein: 7.2 g/dL (ref 6.5–8.1)

## 2022-02-10 LAB — URINALYSIS, ROUTINE W REFLEX MICROSCOPIC
Bilirubin Urine: NEGATIVE
Glucose, UA: NEGATIVE mg/dL
Hgb urine dipstick: NEGATIVE
Ketones, ur: 40 mg/dL — AB
Leukocytes,Ua: NEGATIVE
Nitrite: POSITIVE — AB
Protein, ur: 30 mg/dL — AB
Specific Gravity, Urine: 1.029 (ref 1.005–1.030)
pH: 6 (ref 5.0–8.0)

## 2022-02-10 LAB — CBC
HCT: 41.3 % (ref 36.0–46.0)
Hemoglobin: 14 g/dL (ref 12.0–15.0)
MCH: 29.3 pg (ref 26.0–34.0)
MCHC: 33.9 g/dL (ref 30.0–36.0)
MCV: 86.4 fL (ref 80.0–100.0)
Platelets: 168 10*3/uL (ref 150–400)
RBC: 4.78 MIL/uL (ref 3.87–5.11)
RDW: 12.8 % (ref 11.5–15.5)
WBC: 6.2 10*3/uL (ref 4.0–10.5)
nRBC: 0 % (ref 0.0–0.2)

## 2022-02-10 LAB — LIPASE, BLOOD: Lipase: 18 U/L (ref 11–51)

## 2022-02-10 LAB — PREGNANCY, URINE: Preg Test, Ur: NEGATIVE

## 2022-02-10 MED ORDER — HYOSCYAMINE SULFATE 0.125 MG SL SUBL
0.2500 mg | SUBLINGUAL_TABLET | Freq: Once | SUBLINGUAL | Status: AC
Start: 2022-02-10 — End: 2022-02-10
  Administered 2022-02-10: 0.25 mg via SUBLINGUAL
  Filled 2022-02-10: qty 2

## 2022-02-10 MED ORDER — ALUM & MAG HYDROXIDE-SIMETH 200-200-20 MG/5ML PO SUSP
30.0000 mL | Freq: Once | ORAL | Status: AC
  Administered 2022-02-10: 30 mL via ORAL
  Filled 2022-02-10: qty 30

## 2022-02-10 MED ORDER — ONDANSETRON 4 MG PO TBDP
4.0000 mg | ORAL_TABLET | Freq: Once | ORAL | Status: AC
  Administered 2022-02-10: 4 mg via ORAL
  Filled 2022-02-10: qty 1

## 2022-02-10 MED ORDER — LIDOCAINE VISCOUS HCL 2 % MT SOLN
15.0000 mL | Freq: Once | OROMUCOSAL | Status: AC
Start: 2022-02-10 — End: 2022-02-10
  Administered 2022-02-10: 15 mL via ORAL
  Filled 2022-02-10: qty 15

## 2022-02-10 NOTE — ED Provider Notes (Signed)
[facility] EMERGENCY DEPT Provider Note  CSN: [id number] Arrival date & time: 02/10/22 1647  Chief Complaint(s) Abdominal Pain  HPI Kathy Powell is a 51 y.o. female h/o gastric bypass    Abdominal Pain Pain location:  Epigastric and LUQ Pain quality: cramping   Pain radiates to:  Does not radiate Pain severity:  Moderate Onset quality:  Gradual Duration:  2 days Timing:  Intermittent Progression:  Waxing and waning Chronicity:  New Context: not alcohol use, not diet changes, not eating, not sick contacts and not suspicious food intake   Relieved by:  Antacids Worsened by:  Eating Associated symptoms: diarrhea   Associated symptoms: no fever, no flatus, no nausea and no vomiting    Past Medical History Past Medical History:  Diagnosis Date   Abnormal Pap smear    10 years ago per pt    Anxiety    hx of   Biliary colic    Cancer (HCC)    Depression    hx of   GERD (gastroesophageal reflux disease)    hx of   Hx of migraines    Hypertension    on meds   Hyperthyroidism    Migraine    Thyroid cancer (HCC) 2021   with radiation treatments   Yeast infection of the vagina    Patient Active Problem List   Diagnosis Date Noted   Status post arthroscopy of right knee 09/15/2018   Chronic pain of right knee 08/15/2018   Home Medication(s) Prior to Admission medications   Medication Sig Start Date End Date Taking? Authorizing Provider  alum & mag hydroxide-simeth (MAALOX MAX) 400-400-40 MG/5ML suspension Take 10-15 mLs by mouth every 6 (six) hours as needed for indigestion. 02/11/22  Yes Blaize Epple, Amadeo Garnet, MD  lidocaine (XYLOCAINE) 2 % solution Use as directed 10-15 mLs in the mouth or throat every 6 (six) hours as needed (stomach pain). 02/11/22  Yes Devontae Casasola, Amadeo Garnet, MD  hydrochlorothiazide (HYDRODIURIL) 25 MG tablet Take 25 mg by mouth daily.    [provider]  ibuprofen (ADVIL,MOTRIN) 200 MG tablet Take 400-600 mg by mouth  daily as needed (migraines).    [provider]  levothyroxine (SYNTHROID) 125 MCG tablet Take 125 mcg by mouth daily. 04/24/21   [provider]  lisinopril (ZESTRIL) 5 MG tablet Take 1 tablet by mouth daily at 6 (six) AM. Patient not taking: Reported on 05/16/2021    [provider]  metFORMIN (GLUCOPHAGE) 500 MG tablet Take 1 tablet by mouth 3 (three) times daily. 04/23/21   [provider]  Phentermine HCl 8 MG TABS Take 1 tablet by mouth daily. 04/06/21   [provider]  rizatriptan (MAXALT) 10 MG tablet Take 10 mg by mouth daily as needed. 11/10/20   [provider]  tiZANidine (ZANAFLEX) 4 MG tablet Take 4 mg by mouth 2 (two) times daily as needed for migraine. 03/04/21   [provider]  Allergies Aspirin and Oxycodone hcl  Review of Systems Review of Systems  Constitutional:  Negative for fever.  Gastrointestinal:  Positive for abdominal pain and diarrhea. Negative for flatus, nausea and vomiting.  As noted in HPI  Physical Exam Vital Signs  I have reviewed the triage vital signs BP 132/80   Pulse 62   Temp 98.6 F (37 C) (Oral)   Resp 18   Ht 5\' 6"  (1.676 m)   Wt 99.8 kg   LMP 03/08/2019 (Exact Date) Comment: btl  SpO2 98%   BMI 35.51 kg/m   Physical Exam Vitals reviewed.  Constitutional:      General: She is not in acute distress.    Appearance: She is well-developed. She is not diaphoretic.  HENT:     Head: Normocephalic and atraumatic.     Right Ear: External ear normal.     Left Ear: External ear normal.     Nose: Nose normal.  Eyes:     General: No scleral icterus.    Conjunctiva/sclera: Conjunctivae normal.  Neck:     Trachea: Phonation normal.  Cardiovascular:     Rate and Rhythm: Normal rate and regular rhythm.  Pulmonary:     Effort: Pulmonary effort is normal. No  respiratory distress.     Breath sounds: No stridor.  Abdominal:     General: There is no distension.     Tenderness: There is abdominal tenderness in the epigastric area.  Musculoskeletal:        General: Normal range of motion.     Cervical back: Normal range of motion.  Neurological:     Mental Status: She is alert and oriented to person, place, and time.  Psychiatric:        Behavior: Behavior normal.    ED Results and Treatments Labs (all labs ordered are listed, but only abnormal results are displayed) Labs Reviewed  COMPREHENSIVE METABOLIC PANEL - Abnormal; Notable for the following components:      Result Value   Glucose, Bld 108 (*)    Creatinine, Ser 1.12 (*)    AST 13 (*)    All other components within normal limits  URINALYSIS, ROUTINE W REFLEX MICROSCOPIC - Abnormal; Notable for the following components:   Ketones, ur 40 (*)    Protein, ur 30 (*)    Nitrite POSITIVE (*)    Bacteria, UA RARE (*)    All other components within normal limits  LIPASE, BLOOD  CBC  PREGNANCY, URINE                                                                                                                         EKG  EKG Interpretation  Date/Time:    Ventricular Rate:    PR Interval:    QRS Duration:   QT Interval:    QTC Calculation:   R Axis:     Text Interpretation:         Radiology No results found.  Pertinent  labs & imaging results that were available during my care of the patient were reviewed by me and considered in my medical decision making (see MDM for details).  Medications Ordered in ED Medications  ondansetron (ZOFRAN-ODT) disintegrating tablet 4 mg (4 mg Oral Given 02/10/22 2344)  alum & mag hydroxide-simeth (MAALOX/MYLANTA) 200-200-20 MG/5ML suspension 30 mL (30 mLs Oral Given 02/10/22 2343)    And  lidocaine (XYLOCAINE) 2 % viscous mouth solution 15 mL (15 mLs Oral Given 02/10/22 2343)  hyoscyamine (LEVSIN SL) SL tablet 0.25 mg (0.25 mg Sublingual  Given 02/10/22 2345)                                                                                                                                     Procedures Procedures  (including critical care time)  Medical Decision Making / ED Course    Complexity of Problem:  Co-morbidities/SDOH that complicate the patient evaluation/care: History of Roux-en-Y and cholecystectomy  Additional history obtained: None  Patient's presenting problem/concern and DDX listed below: Abdominal pain Bowel obstruction including internal hernia, gastritis, pancreatitis, viral gastroenteritis.      Complexity of Data:   Cardiac Monitoring: None  Laboratory Tests ordered listed below with my independent interpretation: CBC without leukocytosis or anemia No significant electrolyte derangements or renal sufficiency No evidence of bili obstruction or pancreatitis UA contaminated and not concerning for infection given lack of symptoms   Imaging Studies ordered listed below with my independent interpretation: CT considered initially to assess for possible obstruction but felt unnecessary at this time.  Will reconsider if patient's symptoms do not improve with initial treatment.     ED Course:    Hospitalization Considered:  Yes if patient is found to have obstruction  Assessment, Intervention, and Reassessment: Abdominal pain. Patient declined IV placement or IV hydration ODT Zofran and GI cocktail with Levsin. Complete resolution of patient's pain and cramping Able to tolerate oral intake No need for CT at this time.    Final Clinical Impression(s) / ED Diagnoses Final diagnoses:  Abdominal cramping   The patient appears reasonably screened and/or stabilized for discharge and I doubt any other medical condition or other [facility] requiring further screening, evaluation, or treatment in the ED at this time prior to discharge. Safe for discharge with strict return precautions.  Disposition:  Discharge  Condition: Good  I have discussed the results, Dx and Tx plan with the patient/family who expressed understanding and agree(s) with the plan. Discharge instructions discussed at length. The patient/family was given strict return precautions who verbalized understanding of the instructions. No further questions at time of discharge.    ED Discharge Orders          Ordered    alum & mag hydroxide-simeth (MAALOX MAX) 400-400-40 MG/5ML suspension  Every 6 hours PRN        02/11/22 0136    lidocaine (XYLOCAINE) 2 % solution  Every 6  hours PRN        02/11/22 0136             Follow Up: Sigmund Hazel, MD [address] [place] [address] [phone]  Call  as needed           This chart was dictated using voice recognition software.  Despite best efforts to proofread,  errors can occur which can change the documentation meaning.    Nira Conn, MD 02/11/22 [phone]

## 2022-02-10 NOTE — ED Triage Notes (Signed)
Pt endorses upper abd cramping since Sunday. No n/v, endorses diarrhea and lack of appetite. 4 episodes of diarrhea in last 24 hours.  ?

## 2022-02-11 MED ORDER — LIDOCAINE VISCOUS HCL 2 % MT SOLN: 10.0000 mL | Freq: Four times a day (QID) | OROMUCOSAL | 0 refills | Status: AC | PRN

## 2022-02-11 MED ORDER — MAALOX MAX 400-400-40 MG/5ML PO SUSP: 10.0000 mL | Freq: Four times a day (QID) | ORAL | 0 refills | Status: AC | PRN

## 2022-02-11 NOTE — ED Notes (Signed)
Water given for PO challenge per MD's request ?

## 2022-02-11 NOTE — ED Notes (Signed)
Pt verbalizes understanding of discharge instructions. Opportunity for questioning and answers were provided. Pt discharged from ED to home.   ? ?

## 2022-08-04 ENCOUNTER — Emergency Department (HOSPITAL_BASED_OUTPATIENT_CLINIC_OR_DEPARTMENT_OTHER)
Admission: EM | Admit: 2022-08-04 | Discharge: 2022-08-04 | Disposition: A | Discharge status: Home / Self Care | Payer: No Typology Code available for payment source | Attending: Emergency Medicine | Admitting: Emergency Medicine

## 2022-08-04 ENCOUNTER — Emergency Department (HOSPITAL_BASED_OUTPATIENT_CLINIC_OR_DEPARTMENT_OTHER): Payer: No Typology Code available for payment source

## 2022-08-04 ENCOUNTER — Other Ambulatory Visit: Payer: Self-pay

## 2022-08-04 ENCOUNTER — Encounter (HOSPITAL_BASED_OUTPATIENT_CLINIC_OR_DEPARTMENT_OTHER): Payer: Self-pay | Admitting: Emergency Medicine

## 2022-08-04 DIAGNOSIS — Z85850 Personal history of malignant neoplasm of thyroid: Secondary | ICD-10-CM | POA: Diagnosis not present

## 2022-08-04 DIAGNOSIS — I10 Essential (primary) hypertension: Secondary | ICD-10-CM | POA: Insufficient documentation

## 2022-08-04 DIAGNOSIS — Z79899 Other long term (current) drug therapy: Secondary | ICD-10-CM | POA: Insufficient documentation

## 2022-08-04 DIAGNOSIS — R1030 Lower abdominal pain, unspecified: Secondary | ICD-10-CM | POA: Diagnosis present

## 2022-08-04 DIAGNOSIS — K529 Noninfective gastroenteritis and colitis, unspecified: Secondary | ICD-10-CM | POA: Diagnosis not present

## 2022-08-04 DIAGNOSIS — R197 Diarrhea, unspecified: Secondary | ICD-10-CM

## 2022-08-04 LAB — CBC
HCT: 41 % (ref 36.0–46.0)
Hemoglobin: 14.3 g/dL (ref 12.0–15.0)
MCH: 30.4 pg (ref 26.0–34.0)
MCHC: 34.9 g/dL (ref 30.0–36.0)
MCV: 87.2 fL (ref 80.0–100.0)
Platelets: 149 10*3/uL — ABNORMAL LOW (ref 150–400)
RBC: 4.7 MIL/uL (ref 3.87–5.11)
RDW: 12.4 % (ref 11.5–15.5)
WBC: 4.1 10*3/uL (ref 4.0–10.5)
nRBC: 0 % (ref 0.0–0.2)

## 2022-08-04 LAB — C DIFFICILE QUICK SCREEN W PCR REFLEX
C Diff antigen: NEGATIVE
C Diff interpretation: NOT DETECTED
C Diff toxin: NEGATIVE

## 2022-08-04 LAB — COMPREHENSIVE METABOLIC PANEL
ALT: 21 U/L (ref 0–44)
AST: 22 U/L (ref 15–41)
Albumin: 4.1 g/dL (ref 3.5–5.0)
Alkaline Phosphatase: 113 U/L (ref 38–126)
Anion gap: 10 (ref 5–15)
BUN: 13 mg/dL (ref 6–20)
CO2: 27 mmol/L (ref 22–32)
Calcium: 9.9 mg/dL (ref 8.9–10.3)
Chloride: 102 mmol/L (ref 98–111)
Creatinine, Ser: 1.18 mg/dL — ABNORMAL HIGH (ref 0.44–1.00)
GFR, Estimated: 57 mL/min — ABNORMAL LOW (ref >60)
Glucose, Bld: 119 mg/dL — ABNORMAL HIGH (ref 70–99)
Potassium: 3.6 mmol/L (ref 3.5–5.1)
Sodium: 139 mmol/L (ref 135–145)
Total Bilirubin: 0.9 mg/dL (ref 0.3–1.2)
Total Protein: 8 g/dL (ref 6.5–8.1)

## 2022-08-04 LAB — URINALYSIS, MICROSCOPIC (REFLEX)

## 2022-08-04 LAB — URINALYSIS, ROUTINE W REFLEX MICROSCOPIC
Glucose, UA: NEGATIVE mg/dL
Ketones, ur: NEGATIVE mg/dL
Leukocytes,Ua: NEGATIVE
Nitrite: NEGATIVE
Protein, ur: 100 mg/dL — AB
Specific Gravity, Urine: 1.025 (ref 1.005–1.030)
pH: 6 (ref 5.0–8.0)

## 2022-08-04 LAB — LIPASE, BLOOD: Lipase: 25 U/L (ref 11–51)

## 2022-08-04 LAB — PREGNANCY, URINE: Preg Test, Ur: NEGATIVE

## 2022-08-04 MED ORDER — DICYCLOMINE HCL 20 MG PO TABS: 20.0000 mg | ORAL_TABLET | Freq: Two times a day (BID) | ORAL | 0 refills | Status: AC

## 2022-08-04 MED ORDER — FENTANYL CITRATE PF 50 MCG/ML IJ SOSY
50.0000 ug | PREFILLED_SYRINGE | Freq: Once | INTRAMUSCULAR | Status: AC
  Administered 2022-08-04: 50 ug via INTRAVENOUS
  Filled 2022-08-04: qty 1

## 2022-08-04 MED ORDER — IOHEXOL 300 MG/ML  SOLN
100.0000 mL | Freq: Once | INTRAMUSCULAR | Status: AC | PRN
  Administered 2022-08-04: 100 mL via INTRAVENOUS

## 2022-08-04 MED ORDER — SODIUM CHLORIDE 0.9 % IV BOLUS
1000.0000 mL | Freq: Once | INTRAVENOUS | Status: AC
  Administered 2022-08-04: 1000 mL via INTRAVENOUS

## 2022-08-04 MED ORDER — LOPERAMIDE HCL 2 MG PO CAPS: 2.0000 mg | ORAL_CAPSULE | Freq: Every day | ORAL | 0 refills | Status: AC

## 2022-08-04 MED ORDER — DICYCLOMINE HCL 10 MG PO CAPS
10.0000 mg | ORAL_CAPSULE | Freq: Once | ORAL | Status: AC
  Administered 2022-08-04: 10 mg via ORAL
  Filled 2022-08-04: qty 1

## 2022-08-04 MED ORDER — MORPHINE SULFATE (PF) 4 MG/ML IV SOLN
4.0000 mg | Freq: Once | INTRAVENOUS | Status: AC
  Administered 2022-08-04: 4 mg via INTRAVENOUS
  Filled 2022-08-04: qty 1

## 2022-08-04 NOTE — Discharge Instructions (Addendum)
You were seen in the emergency department today for abdominal pain.  As we discussed your CT scan showed that you have an infection of your colon.  This could be related to bacteria versus virus.  We have collected studies including both, they should result in the next day or so.  You can follow-up the results on MyChart.  I have prescribed you two different medications which should help with your diarrhea.   I am not able to send a referral to [facility] specifically, so I recommend asking your PCP for a referral.

## 2022-08-04 NOTE — ED Provider Notes (Signed)
[facility] Provider Note   CSN: [id number] Arrival date & time: 08/04/22  9528     History  Chief Complaint  Patient presents with   Abdominal Pain    Kathy Powell is a 51 y.o. female with history of migraine, biliary colic, depression, GERD, hypertension, hyperthyroidism, and thyroid cancer who presents to the emergency department complaining of fever, lower abdominal pain and diarrhea for 3 days.  She states that Friday night she initially had a headache and fever.  She was taking medication around-the-clock, and fever broke on Sunday.  On Saturday she started having significant lower abdominal pain and diarrhea.  States that her pain is intermittent, and feels like a contraction/cramp.  She has been having too numerous episodes of diarrhea to count.  No blood in the stool.  No nausea or vomiting.  History of gastric bypass and cholecystectomy.  Was admitted to the hospital back in March for symptoms related to numerous gastric ulcers.  No recent antibiotic use.   Abdominal Pain Associated symptoms: chills, diarrhea and fever   Associated symptoms: no dysuria, no nausea and no vomiting        Home Medications Prior to Admission medications   Medication Sig Start Date End Date Taking? Authorizing Provider  dicyclomine (BENTYL) 20 MG tablet Take 1 tablet (20 mg total) by mouth 2 (two) times daily. 08/04/22  Yes Tessla Spurling T, PA-C  loperamide (IMODIUM) 2 MG capsule Take 1 capsule (2 mg total) by mouth daily. Take two tablets once, then 1 tablet every each loose stool. Do not take more than 8 tablets in one day. 08/04/22  Yes Myalee Stengel T, PA-C  alum & mag hydroxide-simeth (MAALOX MAX) 400-400-40 MG/5ML suspension Take 10-15 mLs by mouth every 6 (six) hours as needed for indigestion. 02/11/22   Cardama, Grayce Sessions, MD  hydrochlorothiazide (HYDRODIURIL) 25 MG tablet Take 25 mg by mouth daily.    [provider]  ibuprofen  (ADVIL,MOTRIN) 200 MG tablet Take 400-600 mg by mouth daily as needed (migraines).    [provider]  levothyroxine (SYNTHROID) 125 MCG tablet Take 125 mcg by mouth daily. 04/24/21   [provider]  lidocaine (XYLOCAINE) 2 % solution Use as directed 10-15 mLs in the mouth or throat every 6 (six) hours as needed (stomach pain). 02/11/22   Cardama, Grayce Sessions, MD  lisinopril (ZESTRIL) 5 MG tablet Take 1 tablet by mouth daily at 6 (six) AM. Patient not taking: Reported on 05/16/2021    [provider]  metFORMIN (GLUCOPHAGE) 500 MG tablet Take 1 tablet by mouth 3 (three) times daily. 04/23/21   [provider]  Phentermine HCl 8 MG TABS Take 1 tablet by mouth daily. 04/06/21   [provider]  rizatriptan (MAXALT) 10 MG tablet Take 10 mg by mouth daily as needed. 11/10/20   [provider]  tiZANidine (ZANAFLEX) 4 MG tablet Take 4 mg by mouth 2 (two) times daily as needed for migraine. 03/04/21   [provider]      Allergies    Aspirin and Oxycodone hcl    Review of Systems   Review of Systems  Constitutional:  Positive for chills and fever.  Gastrointestinal:  Positive for abdominal pain and diarrhea. Negative for blood in stool, nausea and vomiting.  Genitourinary:  Negative for dysuria.  Neurological:  Positive for headaches.  All other systems reviewed and are negative.   Physical Exam Updated Vital Signs BP 129/83   Pulse 60  Temp 98.5 F (36.9 C) (Oral)   Resp 16   Ht '5\' 6"'$  (1.676 m)   Wt 93 kg   LMP 03/08/2019 (Exact Date) Comment: btl  SpO2 96%   BMI 33.09 kg/m  Physical Exam Vitals and nursing note reviewed.  Constitutional:      Appearance: Normal appearance.  HENT:     Head: Normocephalic and atraumatic.  Eyes:     Conjunctiva/sclera: Conjunctivae normal.  Cardiovascular:     Rate and Rhythm: Normal rate and regular rhythm.  Pulmonary:     Effort: Pulmonary effort is normal. No respiratory distress.      Breath sounds: Normal breath sounds.  Abdominal:     General: There is no distension.     Palpations: Abdomen is soft.     Tenderness: There is abdominal tenderness in the periumbilical area and suprapubic area. There is guarding. There is no rebound.  Skin:    General: Skin is warm and dry.  Neurological:     General: No focal deficit present.     Mental Status: She is alert.     ED Results / Procedures / Treatments   Labs (all labs ordered are listed, but only abnormal results are displayed) Labs Reviewed  COMPREHENSIVE METABOLIC PANEL - Abnormal; Notable for the following components:      Result Value   Glucose, Bld 119 (*)    Creatinine, Ser 1.18 (*)    GFR, Estimated 57 (*)    All other components within normal limits  CBC - Abnormal; Notable for the following components:   Platelets 149 (*)    All other components within normal limits  URINALYSIS, ROUTINE W REFLEX MICROSCOPIC - Abnormal; Notable for the following components:   Hgb urine dipstick TRACE (*)    Bilirubin Urine MODERATE (*)    Protein, ur 100 (*)    All other components within normal limits  URINALYSIS, MICROSCOPIC (REFLEX) - Abnormal; Notable for the following components:   Bacteria, UA FEW (*)    Non Squamous Epithelial PRESENT (*)    All other components within normal limits  C DIFFICILE QUICK SCREEN W PCR REFLEX    STOOL CULTURE  LIPASE, BLOOD  PREGNANCY, URINE    EKG None  Radiology CT ABDOMEN PELVIS W CONTRAST  Result Date: 08/04/2022 CLINICAL DATA:  Provided history: Diarrhea. Abdominal pain, acute, non-localized. Additional history provided: Lower abdominal pain for 2 days, diarrhea. EXAM: CT ABDOMEN AND PELVIS WITH CONTRAST TECHNIQUE: Multidetector CT imaging of the abdomen and pelvis was performed using the standard protocol following bolus administration of intravenous contrast. RADIATION DOSE REDUCTION: This exam was performed according to the departmental dose-optimization program  which includes automated exposure control, adjustment of the mA and/or kV according to patient size and/or use of iterative reconstruction technique. CONTRAST:  15m OMNIPAQUE IOHEXOL 300 MG/ML  SOLN COMPARISON:  CT abdomen/pelvis 06/12/2010. FINDINGS: LOWER CHEST: Visualized heart normal in size. No consolidation within the imaged lung bases. HEPATOBILIARY: The liver is normal in size without focal lesion. Hepatic steatosis. Prior cholecystectomy.No intra or extrahepatic biliary ductal dilatation. PANCREAS: No focal lesion.No ductal dilatation or peripancreatic inflammation. SPLEEN:Normal in size without focal lesion. ADRENALS/URINARY TRACT: Bilateral adrenal nodules, the larger on the left measuring 3 cm. These nodules have not significantly changed in size from the prior CT abdomen/pelvis of 06/12/2010. Findings are compatible with a benign etiology. No follow-up imaging is recommended. The kidneys are normal in size.Symmetric renal enhancement.No hydronephrosis, mass or calculus. The bladder is unremarkable in appearance for  the degree of distension. STOMACH/BOWEL: Prior gastric bypass surgery. No dilated loops of small bowel are demonstrated to suggest small bowel obstruction. Diffuse colonic wall thickening compatible with pancolitis, most notably affecting the cecum/ascending colon. Associated inflammatory stranding surrounding the cecum/ascending colon. Additionally, there is apparent fatty proliferation within portions of the cecum/ascending colon, which may reflect sequelae of recurrent colitis or a background component of chronic colitis. Appendix is unremarkable. VASCULAR/LYMPHATIC: There are lymph nodes which are mildly prominent within the right lower quadrant. However, these lymph nodes are not technically enlarged, measuring subcentimeter in short axis, likely reactive. The IVC, SMV and splenic vein are patent. No abdominal aortic aneurysm. Aortoiliac atherosclerosis. REPRODUCTIVE: Incompletely  assessed by CT modality.No acute or significant finding. OTHER: No abdominopelvic ascites. MUSCULOSKELETAL: Lumbar spondylosis. No acute fracture or aggressive osseous lesion. The body wall is unremarkable. IMPRESSION: 1. Acute pancolitis, most notably affecting the cecum/ascending colon. Although nonspecific, C diff colitis should be excluded. Apparent fatty proliferation within portions of the cecum/ascending colon, which may reflect sequelae of recurrent colitis or a background component of chronic colitis. Consider GI follow-up. 2. Mildly prominent lymph nodes within the right lower quadrant, likely reactive. 3. Prior gastric bypass surgery and cholecystectomy. 4. Hepatic steatosis. 5. Aortic Atherosclerosis (ICD10-I70.0). 6. Additional chronic findings, as described within the body of the report. Electronically Signed   By: Kellie Simmering D.O.   On: 08/04/2022 15:09    Procedures Procedures    Medications Ordered in ED Medications  sodium chloride 0.9 % bolus 1,000 mL (0 mLs Intravenous Stopped 08/04/22 1559)  fentaNYL (SUBLIMAZE) injection 50 mcg (50 mcg Intravenous Given 08/04/22 1424)  iohexol (OMNIPAQUE) 300 MG/ML solution 100 mL (100 mLs Intravenous Contrast Given 08/04/22 1428)  dicyclomine (BENTYL) capsule 10 mg (10 mg Oral Given 08/04/22 1527)  morphine (PF) 4 MG/ML injection 4 mg (4 mg Intravenous Given 08/04/22 1600)    ED Course/ Medical Decision Making/ A&P                           Medical Decision Making Amount and/or Complexity of Data Reviewed Labs: ordered. Radiology: ordered.  Risk Prescription drug management.  This patient is a 51 y.o. female  who presents to the ED for concern of abdominal pain, diarrhea and fever x 3 days. Hospitalized back in March 2023. No recent antibiotics. Hx gastric bypass and cholecystectomy.    Differential diagnoses prior to evaluation: The emergent differential diagnosis includes, but is not limited to,  Infectious causes such as: Viral,  Bacterial, Parasitic; Toxin. Noninfectious causes such as: GI Bleed, Appendicitis, Mesenteric Ischemia, Diverticulitis, endocrine causes (adrenal, thyroid), Toxicologic exposures, Drug-associated, IBD. This is not an exhaustive differential.   Past Medical History / Co-morbidities: Migraine, biliary colic, depression, GERD, hypertension, hyperthyroidism, and thyroid cancer  Additional history: Chart reviewed. Pertinent results include: Patient was seen in the ER for similar symptoms on 3/7, laboratory evaluation was unremarkable and she was discharged with GI cocktail.  She was then admitted to the hospital the following day by her gastric bypass surgeon (had gastric bypass in November 2022), and had an EGD performed. EGD showed marginal ulcer at Liberal anastamosis, multiple ulcers in proximal jejunum.  Physical Exam: Physical exam performed. The pertinent findings include: Normal vital signs, afebrile.  Abdominal tenderness to palpation in the suprapubic and periumbilical region.  Lab Tests/Imaging studies: I personally interpreted labs/imaging and the pertinent results include: CBC unremarkable, CMP with creatinine 1.18, stable compared to prior.  Urinalysis with  trace hemoglobin and moderate bilirubin, negative for infection.  Normal lipase.  Stool culture and C. difficile pending.  CT abdomen pelvis shows acute pancolitis, with likely reactive lymph nodes.  No abscess or perforation. I agree with the radiologist interpretation.  Medications: I ordered medication including pain medication, fluids, and bentyl.  I have reviewed the patients home medicines and have made adjustments as needed. Patient feels much better after medication.    Disposition: After consideration of the diagnostic results and the patients response to treatment, I feel that emergency department workup does not suggest an emergent condition requiring admission or immediate intervention beyond what has been performed at this  time. The plan is: discharge to home with loperamide and bentyl. Low suspicion for C. Diff as patient has no risk factors and reassuring lab work. Will follow up with GI per her PCP. The patient is safe for discharge and has been instructed to return immediately for worsening symptoms, change in symptoms or any other concerns.         Final Clinical Impression(s) / ED Diagnoses Final diagnoses:  Noninfectious gastroenteritis, unspecified type  Lower abdominal pain  Diarrhea of presumed infectious origin    Rx / DC Orders ED Discharge Orders          Ordered    loperamide (IMODIUM) 2 MG capsule  Daily        08/04/22 1639    dicyclomine (BENTYL) 20 MG tablet  2 times daily        08/04/22 1639           Portions of this report may have been transcribed using voice recognition software. Every effort was made to ensure accuracy; however, inadvertent computerized transcription errors may be present.    Kateri Plummer, PA-C 08/04/22 Peterson, [place]uc[place], DO 08/05/22 [phone]

## 2022-08-04 NOTE — ED Notes (Signed)
Stool specimen transported to lab

## 2022-08-04 NOTE — ED Triage Notes (Signed)
Lower abdominal pain x 2 days , diarrhea , fever yesterday . Denies NV.

## 2022-08-04 NOTE — ED Notes (Signed)
Patient transported to CT 

## 2022-08-04 NOTE — ED Notes (Signed)
Pt d/c home per MD order. Discharge summary reviewed with pt, pt verbalizes understanding. Ambulatory off unit. No s/s of acute distress noted. Reports daughter is discharge ride home.

## 2022-08-06 LAB — STOOL CULTURE: E coli, Shiga toxin Assay: NEGATIVE

## 2022-08-06 LAB — STOOL CULTURE REFLEX - RSASHR

## 2022-08-06 LAB — STOOL CULTURE REFLEX - CMPCXR

## 2022-08-11 ENCOUNTER — Ambulatory Visit: Payer: No Typology Code available for payment source | Admitting: Gastroenterology

## 2022-08-11 NOTE — Progress Notes (Deleted)
[facility] Consult Note:  History: Kathy Powell 08/11/2022  Referring provider: Kathyrn Lass, MD  Reason for consult/chief complaint: No chief complaint on file.   Subjective  HPI:  ***  (Normal screening colonoscopy with me in June 2022) Gastric bypass November 2022. EGD in March of this year with epigastric pain, discharged home. Care everywhere encounters suggest she may have had an upper endoscopy in the [facility] shortly thereafter. Bariatric clinic follow-up office note 03/30/2022 indicates patient recovering from "stomach ulcer issues".  ROS:  Review of Systems   Past Medical History: Past Medical History:  Diagnosis Date   Abnormal Pap smear    10 years ago per pt    Anxiety    hx of   Biliary colic    Depression    hx of   GERD (gastroesophageal reflux disease)    hx of   Hx of migraines    Hypertension    on meds   Hyperthyroidism    Migraine    Thyroid cancer ([place]) 2021   with radiation treatments   Yeast infection of the vagina      Past Surgical History: Past Surgical History:  Procedure Laterality Date   CERCLAGE REMOVAL     insertion and removal    CHOLECYSTECTOMY N/A 03/30/2018   Procedure: LAPAROSCOPIC CHOLECYSTECTOMY;  Surgeon: Clovis Riley, MD;  Location: [facility];  Service: General;  Laterality: N/A;   KNEE ARTHROSCOPY Right    MULTIPLE TOOTH EXTRACTIONS     STRABISMUS SURGERY Bilateral 07/07/2019   Procedure: BILATERAL STRABISMUS REPAIR;  Surgeon: Everitt Amber, MD;  Location: [facility];  Service: Ophthalmology;  Laterality: Bilateral;   TUBAL LIGATION     WISDOM TOOTH EXTRACTION       Family History: Family History  Problem Relation Age of Onset   Cancer Paternal Grandmother        lung   Cancer Maternal Grandmother        lung   Stroke Maternal Grandmother    Hypertension Mother    Asthma Son    Thyroid disease Maternal Aunt    Breast cancer Maternal Aunt     Hypertension Maternal Aunt    Breast cancer Maternal Aunt    Colon polyps Neg Hx    Colon cancer Neg Hx    Esophageal cancer Neg Hx    Stomach cancer Neg Hx    Rectal cancer Neg Hx     Social History: Social History   Socioeconomic History   Marital status: Married    Spouse name: Not on file   Number of children: Not on file   Years of education: Not on file   Highest education level: Not on file  Occupational History   Not on file  Tobacco Use   Smoking status: Former    Types: Cigarettes    Quit date: 12/07/2017    Years since quitting: 4.6   Smokeless tobacco: Never  Vaping Use   Vaping Use: Never used  Substance and Sexual Activity   Alcohol use: Not Currently   Drug use: Not Currently   Sexual activity: Yes    Birth control/protection: Surgical    Comment: BTL  Other Topics Concern   Not on file  Social History Narrative   Not on file   Social Determinants of Health   Financial Resource Strain: Not on file  Food Insecurity: Not on file  Transportation Needs: Not on file  Physical Activity: Not on file  Stress: Not on file  Social Connections: Not on file    Allergies: Allergies  Allergen Reactions   Aspirin Nausea And Vomiting    Other reaction(s): stomach upset   Oxycodone Hcl     Other reaction(s): nausea    Outpatient Meds: Current Outpatient Medications  Medication Sig Dispense Refill   alum & mag hydroxide-simeth (MAALOX MAX) 400-400-40 MG/5ML suspension Take 10-15 mLs by mouth every 6 (six) hours as needed for indigestion. 355 mL 0   dicyclomine (BENTYL) 20 MG tablet Take 1 tablet (20 mg total) by mouth 2 (two) times daily. 20 tablet 0   hydrochlorothiazide (HYDRODIURIL) 25 MG tablet Take 25 mg by mouth daily.     ibuprofen (ADVIL,MOTRIN) 200 MG tablet Take 400-600 mg by mouth daily as needed (migraines).     levothyroxine (SYNTHROID) 125 MCG tablet Take 125 mcg by mouth daily.     lidocaine (XYLOCAINE) 2 % solution Use as directed 10-15 mLs  in the mouth or throat every 6 (six) hours as needed (stomach pain). 100 mL 0   lisinopril (ZESTRIL) 5 MG tablet Take 1 tablet by mouth daily at 6 (six) AM. (Patient not taking: Reported on 05/16/2021)     loperamide (IMODIUM) 2 MG capsule Take 1 capsule (2 mg total) by mouth daily. Take two tablets once, then 1 tablet every each loose stool. Do not take more than 8 tablets in one day. 12 capsule 0   metFORMIN (GLUCOPHAGE) 500 MG tablet Take 1 tablet by mouth 3 (three) times daily.     Phentermine HCl 8 MG TABS Take 1 tablet by mouth daily.     rizatriptan (MAXALT) 10 MG tablet Take 10 mg by mouth daily as needed.     tiZANidine (ZANAFLEX) 4 MG tablet Take 4 mg by mouth 2 (two) times daily as needed for migraine.     Current Facility-Administered Medications  Medication Dose Route Frequency Provider Last Rate Last Admin   0.9 %  sodium chloride infusion  500 mL Intravenous Once Doran Stabler, MD          ___________________________________________________________________ Objective   Exam:  LMP 03/08/2019 (Exact Date) Comment: btl Wt Readings from Last 3 Encounters:  08/04/22 205 lb (93 kg)  02/10/22 220 lb (99.8 kg)  05/16/21 244 lb (110.7 kg)    General: ***  Eyes: sclera anicteric, no redness ENT: oral mucosa moist without lesions, no cervical or supraclavicular lymphadenopathy CV: ***, no JVD, no peripheral edema Resp: clear to auscultation bilaterally, normal RR and effort noted GI: soft, *** tenderness, with active bowel sounds. No guarding or palpable organomegaly noted. Skin; warm and dry, no rash or jaundice noted Neuro: awake, alert and oriented x 3. Normal gross motor function and fluent speech  Labs:  ***  Radiologic Studies:  ***  Assessment: No diagnosis found.  ***  Plan:  ***  Thank you for the courtesy of this consult.  Please call me with any questions or concerns.  Nelida Meuse III  CC: Referring provider noted above

## 2022-08-21 ENCOUNTER — Other Ambulatory Visit: Payer: Self-pay | Admitting: Family Medicine

## 2022-08-21 ENCOUNTER — Ambulatory Visit
Admission: RE | Admit: 2022-08-21 | Discharge: 2022-08-21 | Disposition: A | Discharge status: Home / Self Care | Payer: No Typology Code available for payment source | Source: Ambulatory Visit | Attending: Family Medicine | Admitting: Family Medicine

## 2022-08-21 DIAGNOSIS — Z1231 Encounter for screening mammogram for malignant neoplasm of breast: Secondary | ICD-10-CM

## 2022-09-29 ENCOUNTER — Other Ambulatory Visit: Payer: Self-pay | Admitting: Home Modifications

## 2022-09-29 DIAGNOSIS — E042 Nontoxic multinodular goiter: Secondary | ICD-10-CM

## 2022-10-06 ENCOUNTER — Ambulatory Visit
Admission: RE | Admit: 2022-10-06 | Discharge: 2022-10-06 | Disposition: A | Discharge status: Home / Self Care | Payer: No Typology Code available for payment source | Source: Ambulatory Visit | Attending: Home Modifications | Admitting: Home Modifications

## 2022-10-06 DIAGNOSIS — E042 Nontoxic multinodular goiter: Secondary | ICD-10-CM

## 2023-10-10 NOTE — Progress Notes (Signed)
.  Declined SDOH  -Pt declined 2nd BP reading, also states she has not taken her BP medication today

## 2023-11-10 ENCOUNTER — Encounter: Payer: Self-pay | Admitting: *Deleted

## 2023-11-10 NOTE — Progress Notes (Signed)
Pt attended 10/10/23 screening event where her b/p was 154/90, and pt declined a second b/p check. During the event, the pt shared she sees Dr. Sigmund Hazel as her PCP at [facility] care at [facility], and that she had insurance; pt did not identify any SDOH. Event nurse noted that pt had not taken her b/p medications yet on the day of the event. Chart review indicates pt saw her PCP on 09/24/23 for her annual physical exam but [facility] encounter that is visible in [facility] does not show appt details or v/s+physician notes. Chart review also indicates that pt has a future appt with Dr. Sigmund Hazel on 05/22/24, and has future appt with [facility] provider 11/11/23. During event f/u call with pt today, she noted that she had bariatric surgery and had to change her b/p meds and that she and Dr.Miller and her bariatric dr are still watching her b/p. Pt shared that her PCP wanted her to take her Losartan at night and she had missed it the night before the event, so that was why it was elevated. However, pt stated she does check her own b/p at home and most days is is between 118-125/78-86 "which is right where I want it" - pt states she keeps a record for her dr and they continue to adjust her med strength as needed. No additional health equity team support indicated at this time.

## 2024-03-23 ENCOUNTER — Other Ambulatory Visit: Payer: Self-pay | Admitting: Family Medicine

## 2024-03-23 DIAGNOSIS — Z1231 Encounter for screening mammogram for malignant neoplasm of breast: Secondary | ICD-10-CM

## 2024-03-30 ENCOUNTER — Ambulatory Visit
Admission: RE | Admit: 2024-03-30 | Discharge: 2024-03-30 | Disposition: A | Discharge status: Home / Self Care | Source: Ambulatory Visit

## 2024-03-30 DIAGNOSIS — Z1231 Encounter for screening mammogram for malignant neoplasm of breast: Secondary | ICD-10-CM

## 2024-04-04 ENCOUNTER — Other Ambulatory Visit: Payer: Self-pay | Admitting: Family Medicine

## 2024-04-04 DIAGNOSIS — R928 Other abnormal and inconclusive findings on diagnostic imaging of breast: Secondary | ICD-10-CM

## 2024-04-18 ENCOUNTER — Other Ambulatory Visit: Payer: Self-pay | Admitting: Family Medicine

## 2024-04-18 ENCOUNTER — Ambulatory Visit
Admission: RE | Admit: 2024-04-18 | Discharge: 2024-04-18 | Disposition: A | Discharge status: Home / Self Care | Source: Ambulatory Visit | Attending: Family Medicine | Admitting: Family Medicine

## 2024-04-18 DIAGNOSIS — R928 Other abnormal and inconclusive findings on diagnostic imaging of breast: Secondary | ICD-10-CM

## 2024-04-18 DIAGNOSIS — N6310 Unspecified lump in the right breast, unspecified quadrant: Secondary | ICD-10-CM

## 2024-10-24 ENCOUNTER — Encounter: Payer: Self-pay | Admitting: Family Medicine

## 2024-10-24 ENCOUNTER — Other Ambulatory Visit
